# Patient Record
Sex: Female | Born: 1980
Health system: Southern US, Community
[De-identification: ages and names within clinical notes are randomized; demographics above are authoritative.]

## PROBLEM LIST (undated history)

## (undated) ENCOUNTER — Emergency Department (HOSPITAL_COMMUNITY): Discharge status: Absent without leave | Payer: Self-pay

## (undated) DIAGNOSIS — R5383 Other fatigue: Secondary | ICD-10-CM

## (undated) DIAGNOSIS — E559 Vitamin D deficiency, unspecified: Secondary | ICD-10-CM

## (undated) DIAGNOSIS — K589 Irritable bowel syndrome without diarrhea: Secondary | ICD-10-CM

## (undated) DIAGNOSIS — E539 Vitamin B deficiency, unspecified: Secondary | ICD-10-CM

## (undated) DIAGNOSIS — D649 Anemia, unspecified: Secondary | ICD-10-CM

## (undated) DIAGNOSIS — R635 Abnormal weight gain: Secondary | ICD-10-CM

## (undated) DIAGNOSIS — E88819 Insulin resistance, unspecified: Secondary | ICD-10-CM

## (undated) DIAGNOSIS — Z5189 Encounter for other specified aftercare: Secondary | ICD-10-CM

## (undated) DIAGNOSIS — I493 Ventricular premature depolarization: Secondary | ICD-10-CM

## (undated) DIAGNOSIS — G43909 Migraine, unspecified, not intractable, without status migrainosus: Secondary | ICD-10-CM

## (undated) DIAGNOSIS — Z780 Asymptomatic menopausal state: Secondary | ICD-10-CM

## (undated) DIAGNOSIS — T7840XA Allergy, unspecified, initial encounter: Secondary | ICD-10-CM

## (undated) DIAGNOSIS — E8881 Metabolic syndrome: Secondary | ICD-10-CM

## (undated) DIAGNOSIS — K219 Gastro-esophageal reflux disease without esophagitis: Secondary | ICD-10-CM

## (undated) DIAGNOSIS — R0602 Shortness of breath: Secondary | ICD-10-CM

## (undated) HISTORY — DX: Insulin resistance, unspecified: E88.819

## (undated) HISTORY — DX: Shortness of breath: R06.02

## (undated) HISTORY — DX: Irritable bowel syndrome, unspecified: K58.9

## (undated) HISTORY — PX: ENDOMETRIAL ABLATION: SHX621

## (undated) HISTORY — DX: Abnormal weight gain: R63.5

## (undated) HISTORY — DX: Metabolic syndrome: E88.81

## (undated) HISTORY — DX: Gastro-esophageal reflux disease without esophagitis: K21.9

## (undated) HISTORY — DX: Asymptomatic menopausal state: Z78.0

## (undated) HISTORY — DX: Anemia, unspecified: D64.9

## (undated) HISTORY — DX: Vitamin D deficiency, unspecified: E55.9

## (undated) HISTORY — DX: Encounter for other specified aftercare: Z51.89

## (undated) HISTORY — DX: Allergy, unspecified, initial encounter: T78.40XA

## (undated) HISTORY — DX: Vitamin B deficiency, unspecified: E53.9

## (undated) HISTORY — PX: BREAST EXCISIONAL BIOPSY: SUR124

## (undated) HISTORY — DX: Other fatigue: R53.83

---

## 2002-05-14 ENCOUNTER — Inpatient Hospital Stay (HOSPITAL_COMMUNITY): Admission: AD | Admit: 2002-05-14 | Discharge: 2002-05-14 | Payer: Self-pay | Admitting: Obstetrics and Gynecology

## 2002-06-04 HISTORY — PX: TUBAL LIGATION: SHX77

## 2002-06-22 ENCOUNTER — Inpatient Hospital Stay (HOSPITAL_COMMUNITY): Admission: AD | Admit: 2002-06-22 | Discharge: 2002-06-24 | Payer: Self-pay | Admitting: Obstetrics and Gynecology

## 2002-06-23 ENCOUNTER — Encounter (INDEPENDENT_AMBULATORY_CARE_PROVIDER_SITE_OTHER): Payer: Self-pay | Admitting: *Deleted

## 2004-11-25 ENCOUNTER — Other Ambulatory Visit: Admission: RE | Admit: 2004-11-25 | Discharge: 2004-11-25 | Payer: Self-pay | Admitting: Obstetrics and Gynecology

## 2005-05-13 ENCOUNTER — Emergency Department (HOSPITAL_COMMUNITY): Admission: EM | Admit: 2005-05-13 | Discharge: 2005-05-14 | Payer: Self-pay | Admitting: Emergency Medicine

## 2006-07-23 ENCOUNTER — Encounter (INDEPENDENT_AMBULATORY_CARE_PROVIDER_SITE_OTHER): Payer: Self-pay | Admitting: *Deleted

## 2006-07-23 ENCOUNTER — Ambulatory Visit (HOSPITAL_COMMUNITY): Admission: RE | Admit: 2006-07-23 | Discharge: 2006-07-23 | Payer: Self-pay | Admitting: Obstetrics and Gynecology

## 2008-05-04 HISTORY — PX: WISDOM TOOTH EXTRACTION: SHX21

## 2008-07-05 ENCOUNTER — Ambulatory Visit (HOSPITAL_COMMUNITY): Admission: RE | Admit: 2008-07-05 | Discharge: 2008-07-05 | Payer: Self-pay | Admitting: *Deleted

## 2008-11-22 ENCOUNTER — Ambulatory Visit: Payer: Self-pay | Admitting: Diagnostic Radiology

## 2008-11-22 ENCOUNTER — Emergency Department (HOSPITAL_BASED_OUTPATIENT_CLINIC_OR_DEPARTMENT_OTHER): Admission: EM | Admit: 2008-11-22 | Discharge: 2008-11-22 | Payer: Self-pay | Admitting: Emergency Medicine

## 2009-10-25 ENCOUNTER — Encounter: Payer: Self-pay | Admitting: Cardiology

## 2010-09-19 NOTE — Op Note (Signed)
NAMEKEMARI, MARES                ACCOUNT NO.:  [id number]   MEDICAL RECORD NO.:  [id number]                   PATIENT TYPE:  INP   LOCATION:  9142                                 [facility]ILITY:  [facility]   PHYSICIAN:  Janine Limbo, M.D.            DATE OF BIRTH:  Jul 18, 1980   DATE OF PROCEDURE:  06/23/2002  DATE OF DISCHARGE:                                 OPERATIVE REPORT   PREOPERATIVE DIAGNOSES:  1. Postpartum day 0.  2. Desires sterilization.   POSTOPERATIVE DIAGNOSES:  1. Postpartum day 0.  2. Desires sterilization.   PROCEDURE:  Modified [place] bilateral tubal ligation.   SURGEON:  Janine Limbo, M.D.   ANESTHESIA:  Epidural.   DISPOSITION:  The patient is a 30 year old female, now para 2-0-0-2, who  desires permanent sterilization.  She understands the implications for her  procedure, and she accepts the risk of, but not limited to, anesthetic  complications, bleeding, infection, and possible damage to the surrounding  organs.  She understands that there is a small but real failure rate  associated with this procedure (17 per 1000).   FINDINGS:  The fallopian tubes were normal bilaterally.   DESCRIPTION OF PROCEDURE:  The patient was taken to the operating room,  where it was determined that the epidural she had for her labor would be  adequate for her tubal ligation.  The patient's abdomen was prepped with  multiple layers of Betadine and then sterilely draped.  The subumbilical  area was injected with 10 mL of 0.5% Marcaine with epinephrine.  A  subumbilical incision was made and carried sharply through the subcutaneous  tissue, the fascia, and the anterior peritoneum.  The left fallopian tube  was identified and followed to its fimbriated end.  A knuckle of tube was  made on the left using a free tie and then a tie of suture ligature of 0  plain catgut.  The knuckle of tube thus made was excised.  Hemostasis was  noted to be adequate.  An  identical procedure was carried out on the  opposite side.  Again hemostasis was noted to be adequate.  All instruments  were then removed.  The anterior peritoneum and the fascia were closed using  a running suture of 2-0 Vicryl.  The skin was reapproximated using a  subcuticular suture of 4-0 Vicryl.  Sponge, needle, and instrument counts  were correct on two occasions.  The estimated blood loss was less than 5 mL.  The patient tolerated her procedure well.  She was taken to the recovery  room in stable condition.                                               Janine Limbo, M.D.    AVS/MEDQ  D:  06/23/2002  T:  06/24/2002  Job:  [id number]  

## 2010-09-19 NOTE — H&P (Signed)
NAMEMILKA, WINDHOLZ              ACCOUNT NO.:  [id number]   MEDICAL RECORD NO.:  [id number]          PATIENT TYPE:  AMB   LOCATION:  SDC                           [facility]   PHYSICIAN:  Janine Limbo, M.D.DATE OF BIRTH:  1981-01-19   DATE OF ADMISSION:  07/23/2006  DATE OF DISCHARGE:                              HISTORY & PHYSICAL   HISTORY OF PRESENT ILLNESS:  [name] is a 30 year old female, para 2-0-  0-2, who presents for hysteroscopy with possible resection and  dilatation and curettage.  The patient has been followed at the [facility].  The patient has a history of menometrorrhagia.  A hydrosonogram  was performed and the patient was found to have polyps within the  uterus.   OBSTETRICAL HISTORY:  The patient has had two term vaginal deliveries.   DRUG ALLERGIES:  NO KNOWN DRUG ALLERGIES.   PAST MEDICAL HISTORY:  The patient denies hypertension and diarrhea.  The patient was born prematurely herself and did receive a blood  transfusion at birth.   SOCIAL HISTORY:  The patient denies cigarette use, alcohol use, and  recreational drug use.   REVIEW OF SYSTEMS:  Please see the history of present illness.   FAMILY HISTORY:  Noncontributory.   PHYSICAL EXAMINATION:  VITAL SIGNS:  Weight is 143 pounds, height is 5  feet 3 inches.  HEENT:  Within normal limits.  CHEST:  Clear.  HEART:  Regular rate and rhythm.  BREASTS:  Without masses.  ABDOMEN:  Nontender.  EXTREMITIES:  Grossly normal.  NEUROLOGIC:  Grossly normal.  PELVIC:  External genitalia is normal.  Vagina is normal.  Cervix is  nontender.  Uterus is upper limits normal size.  Adnexa:  No masses.  Rectovaginal exam confirms.   ASSESSMENT:  1. Menometrorrhagia.  2. Endometrial polyps.   PLAN:  The patient will undergo hysteroscopy with possible resection of  polyps.  She will then undergo a dilatation and curettage.   She  understands the indications for her surgical procedure and she accepts  the associated risk.      Janine Limbo, M.D.  Electronically Signed     AVS/MEDQ  D:  07/22/2006  T:  07/22/2006  Job:  [id number]

## 2010-09-19 NOTE — Op Note (Signed)
Anna [name], Anna [name]              ACCOUNT NO.:  [id number]   MEDICAL RECORD NO.:  [id number]          PATIENT TYPE:  AMB   LOCATION:  SDC                           [facility]   PHYSICIAN:  Janine Limbo, M.D.DATE OF BIRTH:  30-Nov-1980   DATE OF PROCEDURE:  07/23/2006  DATE OF DISCHARGE:                               OPERATIVE REPORT   PREOPERATIVE DIAGNOSES:  1. Menometrorrhagia.  2. Endometrial polyps.   POSTOPERATIVE DIAGNOSES:  1. Menometrorrhagia.  2. Endometrial polyps.   PROCEDURE:  1. Hysteroscopy with resection of polyps.  2. Dilatation and curettage.   SURGEON:  Janine Limbo, M.D.   FIRST ASSISTANT:  None.   ANESTHESIA:  General.   DISPOSITION:  [name] is a 30 year old female who presents  with menorrhagia.  Sonohysterogram showed endometrial polyps.  The  patient understands the indications for her surgical procedure, and she  accepts the risk of, but not limited to, anesthetic complications,  bleeding, infection, and possible damage to surrounding organs.   FINDINGS:  The uterus was upper limits normal size.  No adnexal masses  were appreciated on exam under anesthesia.  The uterus sounded to 8 cm.  Upon inspection of the endometrium, the patient was noted to have two  endometrial polyps on the right posterior portion of the endometrium.  No other abnormality or pathology was noted.   PROCEDURE:  The patient was taken to the operating room where a general  anesthetic was given.  The patient's abdomen, perineum, and vagina were  prepped with multiple layers of Betadine.  An examination under  anesthesia was performed.  The bladder was drained of urine.  The  patient was then sterilely draped.  A paracervical block was placed  using 10 mL of 0.5% Marcaine with epinephrine.  An additional 10 mL of  0.5% Marcaine with epinephrine were injected directly into the cervix.   An endocervical curettage was obtained.  The uterus was sounded  to 8 cm.  The cervix was then gently dilated.  The diagnostic hysteroscope was  inserted, and the cavity was carefully inspected.  The polyps were  noted.  The operative hysteroscope was then substituted for the  diagnostic hysteroscope.  Using a single loop, the polyps on the  posterior portion of the endometrium were removed.  Hemostasis was noted  to be adequate.  The cavity was then carefully inspected, and no other  pathology was noted.  The hysteroscope was removed.  The endometrial  cavity was then curetted using a medium sharp curette until it was felt  to be completely clean.  Hemostasis was adequate.  The hysteroscope was  again inserted, and the cavity was inspected.  There was no evidence of  damage and no other polyps were noted.  All instruments were then  removed.  Examination under anesthesia was repeated.  The uterus was  noted to be firm.  Sponge, needle, and instrument counts were correct on  two occasions.  The estimated blood loss was 5 mL.  Estimated fluid  deficit was 95 mL, although there was fluid noted on the operator's gown  and on the operating room floor.   The patient was awakened from her anesthetic without difficulty and then  taken to the recovery room in stable condition.   FOLLOWUP INSTRUCTIONS:  The patient will take ibuprofen 600 mg every six  hours as needed for mild to moderate pain.  She will take Vicodin one to  two tablets every four hours as needed for severe pain.  She will return  to see Dr. Stefano Gaul in two to three weeks for followup examination.  She  was given a copy of the postoperative instruction sheet as prepared by  the [facility] for patients who have undergone a  dilatation and curettage.   The endocervical curettage, endometrial polyp, and the endometrial  curettings were all sent to pathology for evaluation.      Janine Limbo, M.D.  Electronically Signed     AVS/MEDQ  D:  07/23/2006  T:   07/23/2006  Job:  [id number]

## 2010-09-19 NOTE — H&P (Signed)
Anna [name], Anna [name]                ACCOUNT NO.:  [id number]   MEDICAL RECORD NO.:  [id number]                   PATIENT TYPE:  INP   LOCATION:  9142                                 [facility]   PHYSICIAN:  Janine Limbo, M.D.            DATE OF BIRTH:  09/14/1980   DATE OF ADMISSION:  06/22/2002  DATE OF DISCHARGE:                                HISTORY & PHYSICAL   HISTORY OF PRESENT ILLNESS:  [name] is a 30 year old married  black female, Gravida II, Para I, 0/0/1 at 34 and 4/7th weeks who presents  with regular uterine contractions since 8:30. She denies leaking, bleeding,  nausea and vomiting. No visual disturbances or headaches. She reports  positive fetal movement. Her pregnancy has been followed by the [facility] and [facility] and has been  remarkable for history of SGA and questionable oligohydramnios with the  first pregnancy; history of abnormal PAP; group B strep negative; and  desires bilateral tubal ligation. Her OB labs were collected on April 04, 2002. Hemoglobin was 10.7 and hematocrit 33.0. Platelets 165,000. Blood type  O+, antibody negative, sickle cell trait negative, RPR non-reactive, Rubella  immune, hepatitis C surface antigen negative, HIV non-reactive, cystic  fibrosis negative. One hour Glucola on April 04, 2002 was 104. Hemoglobin  at that time was 11.0. Culture of vaginal tract for group B strep on  June 07, 2002 was negative.   HISTORY OF PRESENT ILLNESS:  She presented for care on January 31, 2002 at  [redacted] weeks gestation. She was diagnosed with bacterial vaginosis at [redacted] weeks  gestation. Pregnancy ultrasonography at that time showed growth consistent  with previous staging. She reported left inguinal swelling and pain at [redacted]  weeks gestation for which she was given a referral to Dr. Lurene Shadow. That pain  actually resolved as the pregnancy progressed. She reported cramping  at [redacted]  weeks gestation and fetal fibronectin was collected which was negative.  Pregnancy ultrasonography at [redacted] weeks gestation due to size less than dates  showed growth consistent with previous staging. The rest of her prenatal  care was unremarkable.   OBSTETRIC HISTORY:  She is Gravida II, Para I, 0/0/1. In October of 2000,  she vaginally delivered a female infant named Keanthony at [redacted] weeks gestation  after a 7 hour labor. She had an epidural for anesthesia. She was induced  secondary to questionable oligohydramnios.   ALLERGIES:  No known drug allergies.   MEDICATIONS:  She has used Depo-Provera in the past for contraception.   PAST MEDICAL HISTORY:  She had mild dysplasia on her PAP smear in 2002  followed by colposcopy. She has occasional yeast infections. She reports  having had the usual childhood illnesses. She received a blood transfusion  at birth. She has had one episode of cystitis.   FAMILY HISTORY:  Maternal grandfather with myocardial infarction. Maternal  grandmother with heart disease. Mother and multiple other family members  with hypertension. Mother with anemia. Maternal grandmother, grandfather and  paternal grandmother with diabetes mellitus. Maternal grandfather on  dialysis. Multiple family members with history of cancer. Maternal  grandfather with history of cardiovascular accident.   GENETIC HISTORY:  Remarkable for father of the baby's first cousin with  polydactylia and the patient's son has a heart murmur. First cousin with  mitral valve prolapse.   SOCIAL HISTORY:  The patient is married to the father of the baby. His name  is Earl Lites. The patient has one year of college education. Father of the  baby is high school educated and both are employed. They deny any alcohol,  tobacco, or illicit drug use with the pregnancy.   PHYSICAL EXAMINATION:  VITAL SIGNS: Stable. Afebrile.  HEENT: Grossly within normal limits.  CHEST: Clear to auscultation and  percussion.  HEART: Regular rate and rhythm.  ABDOMEN: Gravid and contour fundal height extending approximately 37 cm  above the pubic synthesis. Electronic fetal monitoring showed reassuring  fetal heart rate with uterine contractions every 1 1/2 to 2 1/2 minutes.  Cervical examination 5 cm, 100% effaced, vertex minus 1 with bulging bad of  water.  EXTREMITIES: Within normal limits.   ASSESSMENT:  1. Pregnancy at term.  2. Active labor.   PLAN:  1. Submit to birthing suite for consultation with Dr. Stefano Gaul.  2. Routine orders.  3. Planned epidural.  4. Spontaneous vaginal birth.     Cam Hai, C.N.M.                     Janine Limbo, M.D.    KS/MEDQ  D:  06/22/2002  T:  06/23/2002  Job:  [id number]   cc:   Janine Limbo, M.D.  [address], [address]  [place]  [place] [address]  Fax: [phone]   Leonie Man, M.D.  [address], [address]  [place]  [place] [address]  Fax: [phone]

## 2010-09-19 NOTE — Discharge Summary (Signed)
   Anna [name], Anna [name]                ACCOUNT NO.:  [id number]   MEDICAL RECORD NO.:  [id number]                   PATIENT TYPE:  INP   LOCATION:  9142                                 [facility]   PHYSICIAN:  Janine Limbo, M.D.            DATE OF BIRTH:  20-Feb-1981   DATE OF ADMISSION:  06/22/2002  DATE OF DISCHARGE:  06/24/2002                                 DISCHARGE SUMMARY   ADMISSION DIAGNOSES:  Intrauterine pregnancy, at term, after labor.  Multiparity, and desires bilateral tubal ligation for sterilization.   DISCHARGE DIAGNOSES:  Intrauterine pregnancy, at term, after labor.  Multiparity, and desires bilateral tubal ligation for sterilization.  Status  post normal spontaneous vaginal delivery and status post bilateral tubal  ligation for sterilization, and bottle feeding.   PROCEDURE WITH ADMISSION:  1. A normal spontaneous vaginal delivery on June 23, 2002, of a viable     female infant named Earl Lites.  He had Apgars of 8 and 9, and weighed 6     pounds 7 ounces, and was attended and delivered by Philipp Deputy, C.N.M.  2. Bilateral tubal ligation for sterilization on June 23, 2002, attended     by Dr. Leonard Schwartz.   HOSPITAL COURSE:  The patient is a 30 year old married white female, gravida  2, para 1-0-0-1, at 63 and 4/7 weeks, admitted in active labor who  progressed to normal spontaneous vaginal delivery of a viable female infant  named Engineer, agricultural.  He had Apgars of 8 and 9 and weighed 6 pounds 7  ounces, and he was delivered over an intact perineum with no lacerations.  He was attended and delivered by Philipp Deputy, C.N.M.  The patient desired  bilateral tubal ligation for sterilization and underwent the same on  June 23, 2002, attended by Dr. Leonard Schwartz, with no  complications.  Please see operative note for details.  Postoperatively and  postpartum, the patient has done well.  She is ambulating, voiding, eating  without difficulty.  She is bottle-feeding.  Her vital signs have been  stable, and she has been afebrile throughout her hospital stay.  She is  deemed ready for discharge today.   DISCHARGE INSTRUCTIONS:  As per the [facility] handout.   DISCHARGE MEDICATIONS:  Motrin 600 mg p.o. q.6h. p.r.n. for pain, Tylox 1 to  2 p.o. q.4-6h. p.r.n. for pain, prenatal vitamins daily.   DISCHARGE LABORATORY DATA:  Her hemoglobin is 9.8.  Her WBC count is 10.1,  and her platelets are 141.   DISCHARGE FOLLOWUP:  Will be in 6 weeks at [facility] or p.r.n.     Anna [name], C.N.M.              Janine Limbo, M.D.    SJD/MEDQ  D:  06/24/2002  T:  06/24/2002  Job:  [id number]

## 2010-09-23 ENCOUNTER — Ambulatory Visit (INDEPENDENT_AMBULATORY_CARE_PROVIDER_SITE_OTHER): Payer: 59 | Admitting: Family Medicine

## 2010-09-23 ENCOUNTER — Encounter: Payer: Self-pay | Admitting: Family Medicine

## 2010-09-23 VITALS — BP 100/78 | HR 64 | Temp 98.2°F | Wt 138.0 lb

## 2010-09-23 DIAGNOSIS — M25569 Pain in unspecified knee: Secondary | ICD-10-CM

## 2010-09-23 DIAGNOSIS — M25562 Pain in left knee: Secondary | ICD-10-CM | POA: Insufficient documentation

## 2010-09-23 NOTE — Patient Instructions (Signed)
Knee Pain, General The knee is the complex joint between your thigh and your lower leg. It is made up of bones, tendons, ligaments, and cartilage. The bones that make up the knee are:  The femur in the thigh.   The tibia and fibula in the lower leg.   The patella or kneecap riding in the groove on the lower femur.  CAUSES Knee pain is a common complaint with many causes.  A few of these causes are:  Injury, such as:   A ruptured ligament or tendon injury.   Torn cartilage.    Medical conditions, such as:   Gout   Arthritis   Infections   Overuse, over training or overdoing a physical activity.  Knee pain can be minor or severe. Knee pain can accompany debilitating injury. Minor knee problems often respond well to self-care measures or get well on their own. More serious injuries may need medical intervention or even surgery. Symptoms The knee is complex. Symptoms of knee problems can vary widely. Some of the problems are:  Pain with movement and weight bearing.  Swelling and tenderness.   Buckling of the knee.   Inability to straighten or extend your knee.   Your knee locks and you cannot straighten it.  Warmth and redness with pain and fever.   Deformity or dislocation of the kneecap.   DIAGNOSIS Determining what is wrong may be very straight forward such as when there is an injury. It can also be challenging because of the complexity of the knee. Tests to make a diagnosis may include:  Your caregiver taking a history and doing a physical exam.   Routine X-rays can be used to rule out other problems. X-rays will not reveal a cartilage tear. Some injuries of the knee can be diagnosed by:   Arthroscopy a surgical technique by which a small video camera is inserted through tiny incisions on the sides of the knee. This procedure is used to examine and repair internal knee joint problems. Tiny instruments can be used during arthroscopy to repair the torn knee cartilage  (meniscus).   Arthrography is a radiology technique. A contrast liquid is directly injected into the knee joint. Internal structures of the knee joint then become visible on X-ray film.   An MRI scan is a non x-ray radiology procedure in which magnetic fields and a computer produce two- or three-dimensional images of the inside of the knee. Cartilage tears are often visible using an MRI scanner. MRI scans have largely replaced arthrography in diagnosing cartilage tears of the knee.   Blood work.   Examination of the fluid that helps to lubricate the knee joint (synovial fluid). This is done by taking a sample out using a needle and a syringe.  treatment The treatment of knee problems depends on the cause. Some of these treatments are:  Depending on the injury, proper casting, splinting, surgery or physical therapy care will be needed.   Give yourself adequate recovery time. Do not overuse your joints. If you begin to get sore during workout routines, back off. Slow down or do fewer repetitions.   For repetitive activities such as cycling or running, maintain your strength and nutrition.   Alternate muscle groups. For example if you are a weight lifter, work the upper body on one day and the lower body the next.   Either tight or weak muscles do not give the proper support for your knee. Tight or weak muscles do not absorb the stress placed   on the knee joint. Keep the muscles surrounding the knee strong.   Take care of mechanical problems.   If you have flat feet, orthotics or special shoes may help. See your caregiver if you need help.   Arch supports, sometimes with wedges on the inner or outer aspect of the heel, can help. These can shift pressure away from the side of the knee most bothered by osteoarthritis.   A brace called an "unloader" brace also may be used to help ease the pressure on the most arthritic side of the knee.   If your caregiver has prescribed crutches, braces,  wraps or ice, use as directed. The acronym for this is PRICE. This means protection, rest, ice, compression and elevation.   Nonsteroidal anti-inflammatory drugs (NSAID's), can help relieve pain. But if taken immediately after an injury, they may actually increase swelling. Take NSAID's with food in your stomach. Stop them if you develop stomach problems. Do not take these if you have a history of ulcers, stomach pain or bleeding from the bowel. Do not take without your caregiver's approval if you have problems with fluid retention, heart failure, or kidney problems.   For ongoing knee problems, physical therapy may be helpful.   Glucosamine and chondroitin are over-the-counter dietary supplements. Both may help relieve the pain of osteoarthritis in the knee. These medicines are different from the usual anti-inflammatory drugs. Glucosamine may decrease the rate of cartilage destruction.   Injections of a corticosteroid drug into your knee joint may help reduce the symptoms of an arthritis flare-up. They may provide pain relief that lasts a few months. You may have to wait a few months between injections. The injections do have a small increased risk of infection, water retention and elevated blood sugar levels.   Hyaluronic acid injected into damaged joints may ease pain and provide lubrication. These injections may work by reducing inflammation. A series of shots may give relief for as long as 6 months.   Topical painkillers. Applying certain ointments to your skin may help relieve the pain and stiffness of osteoarthritis. Ask your pharmacist for suggestions. Many over the-counter products are approved for temporary relief of arthritis pain.   In some countries, doctors often prescribe topical NSAID's for relief of chronic conditions such as arthritis and tendinitis. A review of treatment with NSAID creams found that they worked as well as oral medications but without the serious side effects.    Prevention  Maintain a healthy weight. Extra pounds put more strain on your joints.   Get strong, stay limber. Weak muscles are a common cause of knee injuries. Stretching is important. Include flexibility exercises in your workouts.   Be smart about exercise. If you have osteoarthritis, chronic knee pain or recurring injuries, you may need to change the way you exercise. This does not mean you have to stop being active. If your knees ache after jogging or playing basketball, consider switching to swimming, water aerobics or other low-impact activities, at least for a few days a week. Sometimes limiting high-impact activities will provide relief.   Make sure your shoes fit well. Choose footwear that is right for your sport.   Protect your knees. Use the proper gear for knee-sensitive activities. Use kneepads when playing volleyball or laying carpet. Buckle your seat belt every time you drive. Most shattered kneecaps occur in car accidents.   Rest when you are tired.  SEEK MEDICAL CARE IF: You have knee pain that is continual and does not seem   to be getting better.  Seek IMMEDIATE MEDICAL CARE IF:  Your knee joint feels hot to the touch and you have a high fever. MAKE SURE YOU:   Understand these instructions.   Will watch your condition.   Will get help right away if you are not doing well or get worse.  Document Released: 02/15/2007 Document Re-Released: 07/15/2009 [employer] Patient Information 2011 [employer]. 

## 2010-09-23 NOTE — Progress Notes (Signed)
  Subjective:    Anna [name] is a 30 y.o. female who presents with knee pain involving the left knee. Onset was gradual, starting about 7 days ago. Inciting event: none known. Current symptoms include: pain located patella and fluid filled "knot". Pain is aggravated by touching and grinding when walks. Patient has had prior knee problems. Evaluation to date: plain films: degenerative changes and PT evaluation done about 5 years ago---cortisone injection done by UC. Treatment to date: corticosteroid injection which was effective.  The following portions of the patient's history were reviewed and updated as appropriate: allergies, current medications, past family history, past medical history, past social history, past surgical history and problem list.   Review of Systems Pertinent items are noted in HPI.   Objective:    BP 100/78  Pulse 64  Temp(Src) 98.2 F (36.8 C) (Oral)  Wt 138 lb (62.596 kg)  SpO2 98% Right knee: normal and no effusion, full active range of motion, no joint line tenderness, ligamentous structures intact.  Left knee:   + fluid filled tender area ant patella,  Otherwise no errythema   X-ray left knee: not done    Assessment:    Left knee pain    Plan:    Rest, ice, compression, and elevation (RICE) therapy. OTC analgesics as needed. Orthopedics referral.

## 2011-03-07 ENCOUNTER — Inpatient Hospital Stay (INDEPENDENT_AMBULATORY_CARE_PROVIDER_SITE_OTHER)
Admission: RE | Admit: 2011-03-07 | Discharge: 2011-03-07 | Disposition: A | Discharge status: Home / Self Care | Payer: 59 | Source: Ambulatory Visit | Attending: Family Medicine | Admitting: Family Medicine

## 2011-03-07 DIAGNOSIS — R6889 Other general symptoms and signs: Secondary | ICD-10-CM

## 2011-03-07 LAB — POCT URINALYSIS DIP (DEVICE)
Bilirubin Urine: NEGATIVE
Protein, ur: NEGATIVE mg/dL
Urobilinogen, UA: 1 mg/dL (ref 0.0–1.0)
pH: 6.5 (ref 5.0–8.0)

## 2011-03-07 LAB — WET PREP, GENITAL: Yeast Wet Prep HPF POC: NONE SEEN

## 2011-03-07 LAB — POCT PREGNANCY, URINE: Preg Test, Ur: NEGATIVE

## 2011-03-09 ENCOUNTER — Telehealth: Payer: Self-pay | Admitting: *Deleted

## 2011-03-09 LAB — GC/CHLAMYDIA PROBE AMP, GENITAL: GC Probe Amp, Genital: NEGATIVE

## 2011-03-09 MED ORDER — CEPHALEXIN 500 MG PO CAPS: 500.0000 mg | ORAL_CAPSULE | Freq: Four times a day (QID) | ORAL | Status: AC

## 2011-03-09 NOTE — Telephone Encounter (Signed)
Per Dr Laury Axon call Rx for keflex 500mg  1 qd bid for 10 days. Pt aware Rx sent.

## 2011-03-10 NOTE — ED Notes (Signed)
Pt. called back and verified x 2. Pt. given results and told she needs a Rx. of Flagyl.  Pt. wants Rx. Called to [facility] on [address]  Rx. Called to pharmacist @ [phone].

## 2011-03-10 NOTE — ED Notes (Addendum)
GC neg., Chlamydia neg., Wet prep: Mod. Clue cells, mod. WBC's.  Labs shown to Dr. Artis Flock and  Order obtained for Flagyl 250 mg. #21 1 TID.  I called pt and left message to call. Contact # not in service

## 2011-04-14 ENCOUNTER — Other Ambulatory Visit: Payer: Self-pay | Admitting: Family Medicine

## 2011-04-14 DIAGNOSIS — Z2089 Contact with and (suspected) exposure to other communicable diseases: Secondary | ICD-10-CM

## 2011-04-14 MED ORDER — PERMETHRIN 5 % EX CREA: TOPICAL_CREAM | CUTANEOUS | Status: DC

## 2011-05-21 ENCOUNTER — Encounter: Payer: Self-pay | Admitting: Family Medicine

## 2011-09-22 ENCOUNTER — Other Ambulatory Visit: Payer: Self-pay

## 2011-09-22 MED ORDER — VALACYCLOVIR HCL 500 MG PO TABS: 500.0000 mg | ORAL_TABLET | Freq: Every day | ORAL | Status: DC

## 2011-09-24 ENCOUNTER — Other Ambulatory Visit: Payer: Self-pay | Admitting: Obstetrics and Gynecology

## 2011-09-24 MED ORDER — VALACYCLOVIR HCL 500 MG PO TABS: 500.0000 mg | ORAL_TABLET | Freq: Every day | ORAL | Status: AC

## 2011-09-25 ENCOUNTER — Telehealth: Payer: Self-pay | Admitting: Obstetrics and Gynecology

## 2011-09-25 ENCOUNTER — Encounter: Payer: 59 | Admitting: Obstetrics and Gynecology

## 2011-10-14 ENCOUNTER — Ambulatory Visit (INDEPENDENT_AMBULATORY_CARE_PROVIDER_SITE_OTHER): Payer: 59 | Admitting: Obstetrics and Gynecology

## 2011-10-14 ENCOUNTER — Encounter: Payer: Self-pay | Admitting: Obstetrics and Gynecology

## 2011-10-14 VITALS — BP 100/70 | Resp 16 | Ht 62.5 in | Wt 136.0 lb

## 2011-10-14 DIAGNOSIS — R51 Headache: Secondary | ICD-10-CM

## 2011-10-14 DIAGNOSIS — R519 Headache, unspecified: Secondary | ICD-10-CM | POA: Insufficient documentation

## 2011-10-14 DIAGNOSIS — K589 Irritable bowel syndrome without diarrhea: Secondary | ICD-10-CM

## 2011-10-14 DIAGNOSIS — Z01419 Encounter for gynecological examination (general) (routine) without abnormal findings: Secondary | ICD-10-CM

## 2011-10-14 DIAGNOSIS — Z124 Encounter for screening for malignant neoplasm of cervix: Secondary | ICD-10-CM

## 2011-10-14 NOTE — Progress Notes (Signed)
Regular Periods: no Mammogram: no  Monthly Breast Ex.: yes Exercise: yes  Tetanus < 10 years: yes Seatbelts: yes  NI. Bladder Functn.: yes Abuse at home: no  Daily BM's: yes Stressful Work: no  Healthy Diet: yes Sigmoid-Colonoscopy: per pt never.  Calcium: no Medical problems this year: pt states she has pain on left side ovulating.    LAST PAP:09/17/2010 "WNL"  Contraception: BTL   Mammogram:  Per pt never  PCP: Dr.Lowne  PMH: Per pt No changes  FMH: Per Pt no changes.  Last Bone Scan: per pt never.  Subjective:    Anna [name] is a 31 y.o. female, No obstetric history on file., who presents for an annual exam. See above. The patient has a history of herpes virus type I.  She takes Valtrex as needed.  She has irritable bowel syndrome that causes problems for her when she is stressed.  Her headaches are better.  She is not sexually reactive. She is status post endometrial ablation and bilateral tubal ligation.  She had one episode of ovulatory pain in the left lower quadrant.  That has now resolved.  Prior Hysterectomy: No    History   Social History  . Marital Status: Divorced    Spouse Name: N/A    Number of Children: 2  . Years of Education: N/A   Social History Main Topics  . Smoking status: Never Smoker   . Smokeless tobacco: Never Used  . Alcohol Use: No  . Drug Use: No  . Sexually Active: None   Other Topics Concern  . None   Social History Narrative  . None    Menstrual cycle:   LMP: No LMP recorded. Patient has had an ablation.           Cycle: rare periods status post endometrial ablation  The following portions of the patient's history were reviewed and updated as appropriate: allergies, current medications, past family history, past medical history, past social history, past surgical history and problem list.  Review of Systems Pertinent items are noted in HPI. Breast:Negative for breast lump,nipple discharge or nipple  retraction Gastrointestinal: Negative for abdominal pain, change in bowel habits or rectal bleeding Urinary:negative   Objective:    BP 100/70  Resp 16  Ht 5' 2.5" (1.588 m)  Wt 136 lb (61.689 kg)  BMI 24.48 kg/m2    Weight:  Wt Readings from Last 1 Encounters:  10/14/11 136 lb (61.689 kg)          BMI: Body mass index is 24.48 kg/(m^2).  General Appearance: Alert, appropriate appearance for age. No acute distress HEENT: Grossly normal Neck / Thyroid: Supple, no masses, nodes or enlargement Lungs: clear to auscultation bilaterally Back: No CVA tenderness Breast Exam: No masses or nodes.No dimpling, nipple retraction or discharge. Cardiovascular: Regular rate and rhythm. S1, S2, no murmur Gastrointestinal: Soft, non-tender, no masses or organomegaly  ++++++++++++++++++++++++++++++++++++++++++++++++++++++++  Pelvic Exam: External genitalia: normal general appearance Vaginal: normal without tenderness, induration or masses and relaxation noted Cervix: normal appearance Adnexa: normal bimanual exam Uterus: normal size shape and consistency Rectovaginal: not indicated  ++++++++++++++++++++++++++++++++++++++++++++++++++++++++  Lymphatic Exam: Non-palpable nodes in neck, clavicular, axillary, or inguinal regions Neurologic: Normal speech, no tremor  Psychiatric: Alert and oriented, appropriate affect.   Wet Prep:not applicable Urinalysis:not applicable UPT: Not done   Assessment:    Normal gyn exam   Herpes virus 1  Irritable bowel syndrome  Migraine headaches  Overweight or obese: No   Pelvic relaxation: Yes  Plan:    pap smear return annually or prn Contraception:bilateral tubal ligation    STD screen request: none  RPR: No.   HBsAg: No.  Hepatitis C: No.  The updated Pap smear screening guidelines were discussed with the patient. The patient requested that I obtain a Pap smear: Yes.  Kegel exercises discussed: Yes.  Proper diet and regular  exercise were reviewed.  Annual mammograms recommended starting at age 33. Proper breast care was discussed.  Screening colonoscopy is recommended beginning at age 39.  Regular health maintenance was reviewed.  Sleep hygiene was discussed.  Adequate calcium and vitamin D intake was emphasized.  Mylinda Latina.D.

## 2011-10-15 LAB — PAP IG W/ RFLX HPV ASCU

## 2011-10-24 ENCOUNTER — Emergency Department (HOSPITAL_COMMUNITY)
Admission: EM | Admit: 2011-10-24 | Discharge: 2011-10-24 | Disposition: A | Discharge status: Home / Self Care | Payer: 59 | Source: Home / Self Care | Attending: Emergency Medicine | Admitting: Emergency Medicine

## 2011-10-24 ENCOUNTER — Encounter (HOSPITAL_COMMUNITY): Payer: Self-pay

## 2011-10-24 DIAGNOSIS — R002 Palpitations: Secondary | ICD-10-CM

## 2011-10-24 HISTORY — DX: Migraine, unspecified, not intractable, without status migrainosus: G43.909

## 2011-10-24 HISTORY — DX: Ventricular premature depolarization: I49.3

## 2011-10-24 NOTE — Discharge Instructions (Signed)
Follow up with a cardiologist.  No running unless the cardiologist says it's ok to do.    Palpitations  A palpitation is the feeling that your heartbeat is irregular or is faster than normal. Although this is frightening, it usually is not serious. Palpitations may be caused by excesses of smoking, caffeine, or alcohol. They are also brought on by stress and anxiety. Sometimes, they are caused by heart disease. Unless otherwise noted, your caregiver did not find any signs of serious illness at this time. HOME CARE INSTRUCTIONS  To help prevent palpitations:  Drink decaffeinated coffee, tea, and soda pop. Avoid chocolate.   If you smoke or drink alcohol, quit or cut down as much as possible.   Reduce your stress or anxiety level. Biofeedback, yoga, or meditation will help you relax. Physical activity such as swimming, jogging, or walking also may be helpful.  SEEK MEDICAL CARE IF:   You continue to have a fast heartbeat.   Your palpitations occur more often.  SEEK IMMEDIATE MEDICAL CARE IF: You develop chest pain, shortness of breath, severe headache, dizziness, or fainting. Document Released: 04/17/2000 Document Revised: 04/09/2011 Document Reviewed: 06/17/2007 [facility] Patient Information 2012 [place], [place].

## 2011-10-24 NOTE — ED Notes (Signed)
Pt states she has a hx of PVC but states they have gradually gotten worse over past 6 days and she has become symtomatic when working out, today felt chest tightness and dizziness.

## 2011-10-24 NOTE — ED Provider Notes (Signed)
History     CSN: [id number]  Arrival date & time 10/24/11  1207   First MD Initiated Contact with Patient 10/24/11 1452      Chief Complaint  Patient presents with  . Chest Pain    pvcs with chest tightness    (Consider location/radiation/quality/duration/timing/severity/associated sxs/prior treatment) HPI Comments: Pt reports seeing cardiologist 2 years ago for frequent PVCs.  Was supposed to have a stress test but never had it done.  In last 2 years, would occasional feel having one pvc, sensation would last a second and then be over.  5 days ago began having feeling of frequent PVCs again.  They last for hours.  Last night pt couldn't sleep she felt so many palpitations.  Also, pt has new sx where she feels chest tightness when she exercises that started 5 days ago, which is getting worse.  This morning while running she felt chest tightness and dizziness.  At this time, in [facility], she does not have any symptoms and feels fine.    Patient is a 31 y.o. female presenting with palpitations. The history is provided by the patient.  Palpitations  Episode onset: 5 days ago. The problem occurs daily. The problem has been gradually worsening. Associated symptoms include chest pressure and dizziness. Pertinent negatives include no diaphoresis, no fever, no chest pain, no syncope, no abdominal pain, no nausea, no vomiting, no headaches, no weakness and no shortness of breath. She has tried nothing for the symptoms.    Past Medical History  Diagnosis Date  . GERD (gastroesophageal reflux disease)   . IBS (irritable bowel syndrome)   . PVC (premature ventricular contraction)   . Migraine     Past Surgical History  Procedure Date  . Wisdom tooth extraction 2010  . Tubal ligation 06-04-2002    Family History  Problem Relation Age of Onset  . Arthritis Maternal Grandmother   . Arthritis Maternal Uncle   . Colon cancer Maternal Grandmother   . Ovarian cancer Maternal Aunt   . Prostate cancer  Maternal Uncle   . Stroke Maternal Grandfather   . Hypertension Maternal Grandmother   . Kidney disease Maternal Grandfather   . Diabetes Maternal Grandmother   . Diabetes Paternal Grandmother   . Bone cancer Paternal Grandmother     History  Substance Use Topics  . Smoking status: Never Smoker   . Smokeless tobacco: Never Used  . Alcohol Use: No    OB History    Grav Para Term Preterm Abortions TAB SAB Ect Mult Living                  Review of Systems  Constitutional: Negative for fever, diaphoresis and fatigue.  Respiratory: Negative for shortness of breath.   Cardiovascular: Positive for palpitations. Negative for chest pain and syncope.  Gastrointestinal: Negative for nausea, vomiting and abdominal pain.  Neurological: Positive for dizziness. Negative for weakness and headaches.    Allergies  Aciphex and Latex  Home Medications   Current Outpatient Rx  Name Route Sig Dispense Refill  . IMIPRAMINE HCL 25 MG PO TABS Oral Take 25 mg by mouth at bedtime.    Marland Kitchen PERMETHRIN 5 % EX CREA  As directed 60 g 2  . ALIGN PO Oral Take 1 tablet by mouth.      Marland Kitchen VALACYCLOVIR HCL 500 MG PO TABS Oral Take 1 tablet (500 mg total) by mouth daily. 100 tablet 0    BP 118/78  Pulse 71  Temp 98  F (36.7 C) (Oral)  Resp 19  SpO2 100%  Physical Exam  Constitutional: She appears well-developed and well-nourished. No distress.  Neck: Carotid bruit is not present.  Cardiovascular: Normal rate and regular rhythm.  Exam reveals no gallop and no friction rub.   No murmur heard. Pulmonary/Chest: Effort normal and breath sounds normal.    ED Course  Procedures (including critical care time)  Labs Reviewed - No data to display No results found.   1. Palpitations       MDM  Talked with Dr. Chaney Malling about plan for pt.  EKG today is normal, shows normal sinus rhythm, 73bpm. Asymptomatic at this time, pt is to f/u with cardiologist.  Has pcp, states pcp can set her up with appt.   No running until ok'd by cardio.         Cathlyn Parsons, NP 10/24/11 1513  Cathlyn Parsons, NP 10/24/11 1514

## 2011-10-25 NOTE — ED Provider Notes (Signed)
Discussed case with NP, patient is asymptomatic at this time, EKG is normal. Agree that she is appropriate for outpatient followup. See NP's note for details  Domenick Gong, MD  Luiz Blare, MD 10/25/11 2153

## 2011-10-26 ENCOUNTER — Encounter: Payer: Self-pay | Admitting: Family Medicine

## 2011-10-26 ENCOUNTER — Ambulatory Visit (INDEPENDENT_AMBULATORY_CARE_PROVIDER_SITE_OTHER): Payer: 59 | Admitting: Family Medicine

## 2011-10-26 VITALS — BP 98/68 | HR 88

## 2011-10-26 DIAGNOSIS — R079 Chest pain, unspecified: Secondary | ICD-10-CM

## 2011-10-26 DIAGNOSIS — R002 Palpitations: Secondary | ICD-10-CM

## 2011-10-26 LAB — CBC WITH DIFFERENTIAL/PLATELET
Eosinophils Absolute: 0.1 10*3/uL (ref 0.0–0.7)
HCT: 39.1 % (ref 36.0–46.0)
MCV: 91.2 fl (ref 78.0–100.0)
Monocytes Relative: 6.1 % (ref 3.0–12.0)
Neutro Abs: 1.4 10*3/uL (ref 1.4–7.7)
Platelets: 194 10*3/uL (ref 150.0–400.0)
RDW: 13.7 % (ref 11.5–14.6)

## 2011-10-26 LAB — D-DIMER, QUANTITATIVE: D-Dimer, Quant: 0.27 ug/mL-FEU (ref 0.00–0.48)

## 2011-10-26 LAB — BASIC METABOLIC PANEL
CO2: 30 mEq/L (ref 19–32)
Chloride: 101 mEq/L (ref 96–112)
Sodium: 138 mEq/L (ref 135–145)

## 2011-10-26 LAB — HEPATIC FUNCTION PANEL
ALT: 9 U/L (ref 0–35)
Albumin: 4 g/dL (ref 3.5–5.2)
Alkaline Phosphatase: 38 U/L — ABNORMAL LOW (ref 39–117)
Bilirubin, Direct: 0.1 mg/dL (ref 0.0–0.3)
Total Bilirubin: 0.4 mg/dL (ref 0.3–1.2)
Total Protein: 7.3 g/dL (ref 6.0–8.3)

## 2011-10-26 LAB — CARDIAC PANEL: Relative Index: 0.6 calc (ref 0.0–2.5)

## 2011-10-26 MED ORDER — ALPRAZOLAM 0.25 MG PO TABS: 0.2500 mg | ORAL_TABLET | Freq: Three times a day (TID) | ORAL | Status: AC | PRN

## 2011-10-26 NOTE — Addendum Note (Signed)
Addended by: Lelon Perla on: 10/26/2011 05:26 PM   Modules accepted: Orders

## 2011-10-26 NOTE — Patient Instructions (Addendum)
Chest Pain (Nonspecific) It is often hard to give a specific diagnosis for the cause of chest pain. There is always a chance that your pain could be related to something serious, such as a heart attack or a blood clot in the lungs. You need to follow up with your caregiver for further evaluation. CAUSES   Heartburn.   Pneumonia or bronchitis.   Anxiety or stress.   Inflammation around your heart (pericarditis) or lung (pleuritis or pleurisy).   A blood clot in the lung.   A collapsed lung (pneumothorax). It can develop suddenly on its own (spontaneous pneumothorax) or from injury (trauma) to the chest.   Shingles infection (herpes zoster virus).  The chest wall is composed of bones, muscles, and cartilage. Any of these can be the source of the pain.  The bones can be bruised by injury.   The muscles or cartilage can be strained by coughing or overwork.   The cartilage can be affected by inflammation and become sore (costochondritis).  DIAGNOSIS  Lab tests or other studies, such as X-rays, electrocardiography, stress testing, or cardiac imaging, may be needed to find the cause of your pain.  TREATMENT   Treatment depends on what may be causing your chest pain. Treatment may include:   Acid blockers for heartburn.   Anti-inflammatory medicine.   Pain medicine for inflammatory conditions.   Antibiotics if an infection is present.   You may be advised to change lifestyle habits. This includes stopping smoking and avoiding alcohol, caffeine, and chocolate.   You may be advised to keep your head raised (elevated) when sleeping. This reduces the chance of acid going backward from your stomach into your esophagus.   Most of the time, nonspecific chest pain will improve within 2 to 3 days with rest and mild pain medicine.  HOME CARE INSTRUCTIONS   If antibiotics were prescribed, take your antibiotics as directed. Finish them even if you start to feel better.   For the next few  days, avoid physical activities that bring on chest pain. Continue physical activities as directed.   Do not smoke.   Avoid drinking alcohol.   Only take over-the-counter or prescription medicine for pain, discomfort, or fever as directed by your caregiver.   Follow your caregiver's suggestions for further testing if your chest pain does not go away.   Keep any follow-up appointments you made. If you do not go to an appointment, you could develop lasting (chronic) problems with pain. If there is any problem keeping an appointment, you must call to reschedule.  SEEK MEDICAL CARE IF:   You think you are having problems from the medicine you are taking. Read your medicine instructions carefully.   Your chest pain does not go away, even after treatment.   You develop a rash with blisters on your chest.  SEEK IMMEDIATE MEDICAL CARE IF:   You have increased chest pain or pain that spreads to your arm, neck, jaw, back, or abdomen.   You develop shortness of breath, an increasing cough, or you are coughing up blood.   You have severe back or abdominal pain, feel nauseous, or vomit.   You develop severe weakness, fainting, or chills.   You have a fever.  THIS IS AN EMERGENCY. Do not wait to see if the pain will go away. Get medical help at once. Call your local emergency services (911 in [place] Do not drive yourself to the hospital. MAKE SURE YOU:   Understand these instructions.     Will watch your condition.   Will get help right away if you are not doing well or get worse.  Document Released: 01/28/2005 Document Revised: 04/09/2011 Document Reviewed: 11/24/2007 [employer] Patient Information 2012 [employer]. 

## 2011-10-26 NOTE — Progress Notes (Signed)
  cSubjective:    Anna [name] is a 31 y.o. female who presents with chest pain and palpitations. The symptoms are moderate, occur intermittently, and last a few seconds per episode. They tend to occur any time day or night. Cardiac risk factors include: ? her father had a heart problem but was murdered . Aggravating factors: exercise. Relieving factors: none. Associated symptoms: chest pain, dizziness, palpitations and shortness of breath. Patient denies: abdominal pain, calf pain, cough, fatigue, leg swelling, slow heart beat and syncope.  Pt states pain feels like something is sitting on her chest.  She was at [facility] Saturday.  The following portions of the patient's history were reviewed and updated as appropriate: allergies, current medications, past family history, past medical history, past social history, past surgical history and problem list.  Review of Systems Pertinent items are noted in HPI.   Objective:    BP 98/68  Pulse 88  SpO2 99% General appearance: alert, cooperative and no distress Lungs: clear to auscultation bilaterally Heart: S1, S2 normal Extremities: extremities normal, atraumatic, no cyanosis or edema  Cardiographics ECG: reviewed from UC   Assessment:    chest pain and palpitations with pvc   Plan:    check labs Refer to cardiology Follow up prn--go to ER if symptom of chest pain returns

## 2011-10-27 ENCOUNTER — Encounter: Payer: Self-pay | Admitting: Cardiology

## 2011-10-27 ENCOUNTER — Ambulatory Visit (INDEPENDENT_AMBULATORY_CARE_PROVIDER_SITE_OTHER): Payer: 59 | Admitting: Cardiology

## 2011-10-27 VITALS — BP 100/72 | HR 68 | Ht 62.0 in | Wt 135.0 lb

## 2011-10-27 DIAGNOSIS — I493 Ventricular premature depolarization: Secondary | ICD-10-CM | POA: Insufficient documentation

## 2011-10-27 DIAGNOSIS — I4949 Other premature depolarization: Secondary | ICD-10-CM

## 2011-10-27 NOTE — Progress Notes (Signed)
Anna [name] Date of Birth: 03/22/1981 Medical Record #[id number]  History of Present Illness: Anna [name] is seen at the request of Dr. Laury Axon for evaluation of palpitations. She is a very pleasant 31 year old Anna [name] female who reports that she has had palpitations for several years. 2 years ago she began experiencing an increased frequency of palpitations and was evaluated by [facility] cardiology. A 24-hour Holter monitor apparently demonstrated evidence of PVCs. The stress test was recommended but she never followed through. Afterwards her PVCs seem to go away for about 6 months. Since then she has had a steady increase in progression of her palpitations. She describes this as a fluttering sensation in her chest and sometimes like a water ball is being squeezed in her chest. She has no sustained tachycardia. Her symptoms are sporadic. There are no clear aggravating or alleviating factors. For the past 8 days she has experienced a sensation of heaviness in her chest that is constant and worse with movement. She feels that she just needs to take a deep breath. She does not drink caffeine. He does not use decongestants.  Current Outpatient Prescriptions on File Prior to Visit  Medication Sig Dispense Refill  . ALPRAZolam (XANAX) 0.25 MG tablet Take 1 tablet (0.25 mg total) by mouth 3 (three) times daily as needed for sleep.  30 tablet  0  . Probiotic Product (ALIGN PO) Take 1 tablet by mouth.        . valACYclovir (VALTREX) 500 MG tablet Take 1 tablet (500 mg total) by mouth daily.  100 tablet  0  . imipramine (TOFRANIL) 25 MG tablet Take 25 mg by mouth at bedtime.        Allergies  Allergen Reactions  . Aciphex (Rabeprazole Sodium)   . Latex     Past Medical History  Diagnosis Date  . GERD (gastroesophageal reflux disease)   . IBS (irritable bowel syndrome)   . PVC (premature ventricular contraction)   . Migraine     Past Surgical History  Procedure Date  . Wisdom tooth  extraction 2010  . Tubal ligation 06-04-2002    History  Smoking status  . Never Smoker   Smokeless tobacco  . Never Used    History  Alcohol Use No    Family History  Problem Relation Age of Onset  . Arthritis Maternal Grandmother   . Colon cancer Maternal Grandmother   . Hypertension Maternal Grandmother   . Diabetes Maternal Grandmother   . Arthritis Maternal Uncle   . Heart disease Maternal Uncle   . Ovarian cancer Maternal Aunt   . Prostate cancer Maternal Uncle   . Stroke Maternal Grandfather   . Kidney disease Maternal Grandfather   . Heart disease Maternal Grandfather   . Kidney failure Maternal Grandfather   . Diabetes Maternal Grandfather   . Diabetes Paternal Grandmother   . Bone cancer Paternal Grandmother     Review of Systems: The review of systems is positive for palpitations.  All other systems were reviewed and are negative.  Physical Exam: BP 100/72  Pulse 68  Ht 5\' 2"  (1.575 m)  Wt 135 lb (61.236 kg)  BMI 24.69 kg/m2 She is a pleasant young black female in no acute distress. Her HEENT exam is normal. Oropharynx is clear. Neck is supple without JVD, adenopathy, thyromegaly, or bruits. Lungs are clear. Cardiac exam reveals a regular rate and rhythm without gallop, murmur, or click. Abdomen is soft and nontender. She has no masses or bruits. Extremities are without  edema. Pulses are 2+ and symmetric. Skin is warm and dry. She is alert and oriented x3. Cranial nerves II through XII are intact.  LABORATORY DATA: ECG shows normal sinus rhythm with a normal ECG. Recent laboratory data including chemistries, CBC, thyroid function studies, d-dimer, and cardiac enzymes were all normal.  Assessment / Plan:

## 2011-10-27 NOTE — Patient Instructions (Signed)
We will obtain an Echocardiogram.

## 2011-10-27 NOTE — Assessment & Plan Note (Signed)
She has a known history of PVCs. Her recent palpitations are consistent with this. There are no clear aggravating factors. Her cardiac exam and ECG are normal. I suspect that the PVCs are benign. I recommended an echocardiogram to rule out structural heart disease. If her heart is structurally normal that we can reassure her that these are benign and do not necessarily require treatment. If so then I may prescribe her a beta blocker to use on a when necessary basis. We discussed avoidance of cardiac stimulants which she is already doing.

## 2011-10-29 ENCOUNTER — Telehealth: Payer: Self-pay | Admitting: Cardiology

## 2011-10-29 NOTE — Telephone Encounter (Signed)
Patient called wanted to make sure where holter monitor was done 2 years ago.Stated done at [facility] was called and a message was left to fax holter monitor done 2 yrs ago.

## 2011-10-29 NOTE — Telephone Encounter (Signed)
Follow up on previous call:  Can return call back to nurse.

## 2011-11-04 ENCOUNTER — Ambulatory Visit (HOSPITAL_COMMUNITY): Payer: 59 | Attending: Cardiology | Admitting: Radiology

## 2011-11-04 DIAGNOSIS — R002 Palpitations: Secondary | ICD-10-CM | POA: Insufficient documentation

## 2011-11-04 DIAGNOSIS — I059 Rheumatic mitral valve disease, unspecified: Secondary | ICD-10-CM | POA: Insufficient documentation

## 2011-11-04 DIAGNOSIS — I493 Ventricular premature depolarization: Secondary | ICD-10-CM

## 2011-11-04 DIAGNOSIS — R072 Precordial pain: Secondary | ICD-10-CM | POA: Insufficient documentation

## 2011-11-04 NOTE — Progress Notes (Signed)
Echocardiogram performed.  

## 2011-12-16 ENCOUNTER — Ambulatory Visit (INDEPENDENT_AMBULATORY_CARE_PROVIDER_SITE_OTHER): Payer: 59 | Admitting: Psychology

## 2011-12-16 DIAGNOSIS — F410 Panic disorder [episodic paroxysmal anxiety] without agoraphobia: Secondary | ICD-10-CM

## 2011-12-30 ENCOUNTER — Ambulatory Visit (INDEPENDENT_AMBULATORY_CARE_PROVIDER_SITE_OTHER): Payer: 59 | Admitting: Psychology

## 2011-12-30 DIAGNOSIS — F411 Generalized anxiety disorder: Secondary | ICD-10-CM

## 2012-01-13 ENCOUNTER — Ambulatory Visit (INDEPENDENT_AMBULATORY_CARE_PROVIDER_SITE_OTHER): Payer: 59 | Admitting: Psychology

## 2012-01-13 DIAGNOSIS — F411 Generalized anxiety disorder: Secondary | ICD-10-CM

## 2012-02-03 ENCOUNTER — Ambulatory Visit (INDEPENDENT_AMBULATORY_CARE_PROVIDER_SITE_OTHER): Payer: 59 | Admitting: Psychology

## 2012-02-03 DIAGNOSIS — F411 Generalized anxiety disorder: Secondary | ICD-10-CM

## 2012-02-17 ENCOUNTER — Ambulatory Visit: Payer: 59 | Admitting: Psychology

## 2012-02-29 ENCOUNTER — Other Ambulatory Visit: Payer: 59

## 2012-02-29 ENCOUNTER — Other Ambulatory Visit (INDEPENDENT_AMBULATORY_CARE_PROVIDER_SITE_OTHER): Payer: 59

## 2012-02-29 ENCOUNTER — Other Ambulatory Visit: Payer: Self-pay | Admitting: Family Medicine

## 2012-02-29 DIAGNOSIS — R300 Dysuria: Secondary | ICD-10-CM

## 2012-02-29 DIAGNOSIS — N390 Urinary tract infection, site not specified: Secondary | ICD-10-CM

## 2012-02-29 LAB — POCT URINALYSIS DIPSTICK
Nitrite, UA: NEGATIVE
Spec Grav, UA: 1.015
Urobilinogen, UA: 0.2

## 2012-02-29 MED ORDER — CIPROFLOXACIN HCL 500 MG PO TABS: 500.0000 mg | ORAL_TABLET | Freq: Two times a day (BID) | ORAL | Status: AC

## 2012-03-03 ENCOUNTER — Other Ambulatory Visit: Payer: Self-pay | Admitting: Family Medicine

## 2012-03-03 LAB — URINE CULTURE

## 2012-03-03 MED ORDER — FLUCONAZOLE 150 MG PO TABS: ORAL_TABLET | ORAL | Status: DC

## 2012-03-29 ENCOUNTER — Other Ambulatory Visit: Payer: Self-pay | Admitting: Family Medicine

## 2012-03-29 MED ORDER — DICYCLOMINE HCL 20 MG PO TABS: ORAL_TABLET | ORAL | Status: DC

## 2012-05-23 ENCOUNTER — Telehealth: Payer: Self-pay

## 2012-05-23 ENCOUNTER — Inpatient Hospital Stay (HOSPITAL_COMMUNITY)
Admission: AD | Admit: 2012-05-23 | Discharge: 2012-05-23 | Disposition: A | Discharge status: Home / Self Care | Payer: 59 | Source: Ambulatory Visit | Attending: Obstetrics and Gynecology | Admitting: Obstetrics and Gynecology

## 2012-05-23 ENCOUNTER — Inpatient Hospital Stay (HOSPITAL_COMMUNITY): Payer: 59

## 2012-05-23 ENCOUNTER — Encounter (HOSPITAL_COMMUNITY): Payer: Self-pay

## 2012-05-23 DIAGNOSIS — N72 Inflammatory disease of cervix uteri: Secondary | ICD-10-CM | POA: Insufficient documentation

## 2012-05-23 DIAGNOSIS — R1031 Right lower quadrant pain: Secondary | ICD-10-CM | POA: Insufficient documentation

## 2012-05-23 LAB — URINALYSIS, ROUTINE W REFLEX MICROSCOPIC
Leukocytes, UA: NEGATIVE
Nitrite: NEGATIVE
Specific Gravity, Urine: >1.03 — ABNORMAL HIGH (ref 1.005–1.030)
Urobilinogen, UA: 0.2 mg/dL (ref 0.0–1.0)

## 2012-05-23 LAB — CBC WITH DIFFERENTIAL/PLATELET
Basophils Absolute: 0 10*3/uL (ref 0.0–0.1)
Eosinophils Absolute: 0.1 10*3/uL (ref 0.0–0.7)
Eosinophils Relative: 3 % (ref 0–5)
Lymphocytes Relative: 45 % (ref 12–46)
Lymphs Abs: 1.5 10*3/uL (ref 0.7–4.0)
MCV: 88.2 fL (ref 78.0–100.0)
Neutrophils Relative %: 44 % (ref 43–77)
Platelets: 200 10*3/uL (ref 150–400)
RBC: 4.07 MIL/uL (ref 3.87–5.11)
RDW: 12.8 % (ref 11.5–15.5)
WBC: 3.3 10*3/uL — ABNORMAL LOW (ref 4.0–10.5)

## 2012-05-23 LAB — URINE MICROSCOPIC-ADD ON

## 2012-05-23 MED ORDER — IBUPROFEN 600 MG PO TABS: 600.0000 mg | ORAL_TABLET | Freq: Four times a day (QID) | ORAL | Status: DC | PRN

## 2012-05-23 NOTE — Progress Notes (Addendum)
History  Anna [name] is a 32 y.o. G2P2002 at Unknown   Chief Complaint  Patient presents with  . Abdominal Pain  woken up out of my sleep about 445 from the pain. States she is having a little nausea but denies any vomiting, no diarrhea, had bm yesterday, no menses, no new sex;ual partners.    Unknown   Chief Complaint  Patient presents with  . Abdominal Pain   @SFHPI @  Prior to Admission medications   Medication Sig Start Date End Date Taking? Authorizing Provider  dicyclomine (BENTYL) 20 MG tablet TID prn for IBS 03/29/12  Yes Sheliah Hatch, MD  Probiotic Product (ALIGN PO) Take 1 tablet by mouth.     Yes Historical Provider, MD  fluconazole (DIFLUCAN) 150 MG tablet As directed 03/03/12   Lelon Perla, DO  imipramine (TOFRANIL) 25 MG tablet Take 25 mg by mouth at bedtime.    Historical Provider, MD  valACYclovir (VALTREX) 500 MG tablet Take 1 tablet (500 mg total) by mouth daily. 09/24/11 09/23/12  Kirkland Hun, MD    Patient Active Problem List  Diagnosis  . Knee pain, left  . Irritable bowel syndrome  . Headache  . PVC's (premature ventricular contractions)   Vitals:  Blood pressure 115/69, pulse 65, temperature 98.6 F (37 C), temperature source Oral, resp. rate 18, height 5' 2.5" (1.588 m), weight 134 lb (60.782 kg), SpO2 100.00%. OB History    Grav Para Term Preterm Abortions TAB SAB Ect Mult Living   2 2 2       2       Past Medical History  Diagnosis Date  . GERD (gastroesophageal reflux disease)   . IBS (irritable bowel syndrome)   . PVC (premature ventricular contraction)   . Migraine     Past Surgical History  Procedure Date  . Wisdom tooth extraction 2010  . Tubal ligation 06-04-2002  . Endometrial ablation     Family History  Problem Relation Age of Onset  . Arthritis Maternal Grandmother   . Colon cancer Maternal Grandmother   . Hypertension Maternal Grandmother   . Diabetes Maternal Grandmother   . Arthritis Maternal Uncle   .  Heart disease Maternal Uncle   . Ovarian cancer Maternal Aunt   . Prostate cancer Maternal Uncle   . Stroke Maternal Grandfather   . Kidney disease Maternal Grandfather   . Heart disease Maternal Grandfather   . Kidney failure Maternal Grandfather   . Diabetes Maternal Grandfather   . Diabetes Paternal Grandmother   . Bone cancer Paternal Grandmother     History  Substance Use Topics  . Smoking status: Never Smoker   . Smokeless tobacco: Never Used  . Alcohol Use: No    Allergies:  Allergies  Allergen Reactions  . Aciphex (Rabeprazole Sodium)   . Latex     Prescriptions prior to admission  Medication Sig Dispense Refill  . dicyclomine (BENTYL) 20 MG tablet TID prn for IBS  90 tablet  1  . Probiotic Product (ALIGN PO) Take 1 tablet by mouth.        . fluconazole (DIFLUCAN) 150 MG tablet As directed  30 tablet  1  . imipramine (TOFRANIL) 25 MG tablet Take 25 mg by mouth at bedtime.      . valACYclovir (VALTREX) 500 MG tablet Take 1 tablet (500 mg total) by mouth daily.  100 tablet  0    @ROS @ Physical Exam   Blood pressure 115/69, pulse 65, temperature 98.6 F (37  C), temperature source Oral, resp. rate 18, height 5' 2.5" (1.588 m), weight 134 lb (60.782 kg), SpO2 100.00%.  @PHYSEXAMBYAGE2 @ Labs:  No results found for this or any previous visit (from the past 24 hour(s)). ASSESSMENT: Patient Active Problem List  Diagnosis  . Knee pain, left  . Irritable bowel syndrome  . Headache  . PVC's (premature ventricular contractions)   Physical Examination: General appearance - alert, well appearing, and in no distress Physical exam: Calm, no distress, HEENT wnl lungs clear bilaterally, AP RRR, abd soft, gravid, nt, bowel sounds active, abdomen nontender no rebound tenderness Normal hair distrubition mons pubis,  EGBUS WNL, sterile speculum exam,  vagina pink, moist normal rugae,  Vag LTC no CMT No edema to lower extremities  ED Course  Assessment/Plan RLQ pain ua  pg test, ua, cbc, gc,chl, pelvic US report given to Dr. Su Hilt per telephone. Lavera Guise, CNM      Report from RN: pain is much better without any pain medication 4/10. Ultrasound is normal except for expected changes 2nd to endometrial ablation. CBC all WNL.  Instructed to D/C patient home with Ibuprofen 600 mg every 6 hours for 24 hours then PRN and to call office to follow-up with her gyn for long term plan.

## 2012-05-23 NOTE — MAU Note (Signed)
Pt states that she was woken up about 445 from the pain. States she is having a little nausea but denies any vomiting.

## 2012-05-23 NOTE — Progress Notes (Signed)
MD informed of U/S & lab results, is entering DC order.

## 2012-05-23 NOTE — Telephone Encounter (Signed)
Tc from pt. Pt seen at [facility] today for RLQ pain. Pt needs follow up with AVS per SR. Appt sched 06/08/12 @ 3:15 with AVS for eval. Pt agrees.

## 2012-06-08 ENCOUNTER — Encounter: Payer: Self-pay | Admitting: Obstetrics and Gynecology

## 2012-06-08 ENCOUNTER — Ambulatory Visit: Payer: 59 | Admitting: Obstetrics and Gynecology

## 2012-06-08 VITALS — BP 102/60 | Temp 98.6°F | Wt 134.0 lb

## 2012-06-08 DIAGNOSIS — R109 Unspecified abdominal pain: Secondary | ICD-10-CM

## 2012-06-08 NOTE — Progress Notes (Signed)
HISTORY OF PRESENT ILLNESS  [name] is a 32 y.o. year old female,G2P2002, who presents for a problem visit. The patient was seen in the emergency department in January 2014 because of abdominal pain area initially the pain was on the left and then on the right.  Her white blood cell count was normal.  Gonorrhea and Chlamydia tests were negative.  A pelvic ultrasound was performed and she was found to have normal female organs.  The patient is not sexually active.  She has had an endometrial ablation in the past.  She has a history of irritable bowel syndrome. A urinalysis was negative.  No urine culture was performed.  The patient had a Klebsiella urinary tract infection in October 2013.  Subjective:  The patient feels somewhat better at this time. She denies diarrhea, and rectal bleeding.  No GU complaints.  No fever or chills. The patient complains of a tender bone on the left labia.  Objective:  BP 102/60  Temp 98.6 F (37 C) (Oral)  Wt 134 lb (60.782 kg)   General: no distress GI: soft and nontender  External genitalia: normal general appearance. Tender inclusion cysts on the left labia minora. Vaginal: normal without tenderness, induration or masses and relaxation noted Cervix: normal appearance Adnexa: normal bimanual exam Uterus: upper limits normal size  Assessment:  Abdominal pain of uncertain etiology  Urinary tract infection in October 2013  History of irritable bowel syndrome  Tender inclusion cyst on the left labia minora  Plan:  Urine culture  Heat and observation.  Call if inclusion cyst does not improve.  Return to office prn if symptoms worsen or fail to improve. Return for annual exam.  Leonard Schwartz M.D.  06/08/2012 6:14 PM

## 2012-06-13 ENCOUNTER — Other Ambulatory Visit: Payer: 59

## 2012-06-13 ENCOUNTER — Telehealth: Payer: Self-pay

## 2012-06-13 ENCOUNTER — Other Ambulatory Visit: Payer: Self-pay

## 2012-06-13 DIAGNOSIS — R109 Unspecified abdominal pain: Secondary | ICD-10-CM

## 2012-06-13 MED ORDER — METRONIDAZOLE 500 MG PO TABS: 500.0000 mg | ORAL_TABLET | Freq: Two times a day (BID) | ORAL | Status: AC

## 2012-06-13 NOTE — Telephone Encounter (Signed)
Tc from pt. Pt c/o slight vaginal odor without irritation. Pt would like rx sent to pharm if not, pt will come in for eval. Consulted with AVS, pt may have MTZ. Rx for MTZ e-pres to pharm on file. Pt agrees

## 2012-06-15 LAB — URINE CULTURE: Colony Count: 25000

## 2012-09-14 ENCOUNTER — Other Ambulatory Visit: Payer: Self-pay | Admitting: Family Medicine

## 2012-09-14 ENCOUNTER — Encounter: Payer: Self-pay | Admitting: Gastroenterology

## 2012-09-14 DIAGNOSIS — K589 Irritable bowel syndrome without diarrhea: Secondary | ICD-10-CM

## 2012-09-16 ENCOUNTER — Other Ambulatory Visit (INDEPENDENT_AMBULATORY_CARE_PROVIDER_SITE_OTHER): Payer: 59

## 2012-09-16 ENCOUNTER — Encounter: Payer: Self-pay | Admitting: Gastroenterology

## 2012-09-16 ENCOUNTER — Ambulatory Visit (INDEPENDENT_AMBULATORY_CARE_PROVIDER_SITE_OTHER): Payer: 59 | Admitting: Gastroenterology

## 2012-09-16 VITALS — BP 90/60 | HR 72 | Ht 63.0 in | Wt 133.5 lb

## 2012-09-16 DIAGNOSIS — K5900 Constipation, unspecified: Secondary | ICD-10-CM

## 2012-09-16 DIAGNOSIS — K5989 Other specified functional intestinal disorders: Secondary | ICD-10-CM

## 2012-09-16 DIAGNOSIS — Z800 Family history of malignant neoplasm of digestive organs: Secondary | ICD-10-CM

## 2012-09-16 DIAGNOSIS — R1013 Epigastric pain: Secondary | ICD-10-CM

## 2012-09-16 DIAGNOSIS — K219 Gastro-esophageal reflux disease without esophagitis: Secondary | ICD-10-CM

## 2012-09-16 DIAGNOSIS — K599 Functional intestinal disorder, unspecified: Secondary | ICD-10-CM

## 2012-09-16 LAB — CBC WITH DIFFERENTIAL/PLATELET
Basophils Absolute: 0 10*3/uL (ref 0.0–0.1)
Eosinophils Absolute: 0.1 10*3/uL (ref 0.0–0.7)
Eosinophils Relative: 2.8 % (ref 0.0–5.0)
HCT: 41 % (ref 36.0–46.0)
Lymphs Abs: 0.8 10*3/uL (ref 0.7–4.0)
MCHC: 34.4 g/dL (ref 30.0–36.0)
MCV: 86.9 fl (ref 78.0–100.0)
Monocytes Absolute: 0.3 10*3/uL (ref 0.1–1.0)
Neutrophils Relative %: 51.5 % (ref 43.0–77.0)
Platelets: 193 10*3/uL (ref 150.0–400.0)
RDW: 13.7 % (ref 11.5–14.6)

## 2012-09-16 LAB — IBC PANEL
Saturation Ratios: 5.9 % — ABNORMAL LOW (ref 20.0–50.0)
Transferrin: 217.9 mg/dL (ref 212.0–360.0)

## 2012-09-16 LAB — FOLATE: Folate: 18.2 ng/mL (ref >5.9)

## 2012-09-16 LAB — COMPREHENSIVE METABOLIC PANEL
AST: 23 U/L (ref 0–37)
Alkaline Phosphatase: 43 U/L (ref 39–117)
Glucose, Bld: 79 mg/dL (ref 70–99)
Sodium: 136 mEq/L (ref 135–145)
Total Bilirubin: 0.5 mg/dL (ref 0.3–1.2)
Total Protein: 7.2 g/dL (ref 6.0–8.3)

## 2012-09-16 LAB — VITAMIN B12: Vitamin B-12: 153 pg/mL — ABNORMAL LOW (ref 211–911)

## 2012-09-16 LAB — HEPATIC FUNCTION PANEL
Bilirubin, Direct: 0.1 mg/dL (ref 0.0–0.3)
Total Bilirubin: 0.5 mg/dL (ref 0.3–1.2)

## 2012-09-16 MED ORDER — MOVIPREP 100 G PO SOLR: 1.0000 | Freq: Once | ORAL | Status: DC

## 2012-09-16 MED ORDER — DICYCLOMINE HCL 10 MG PO CAPS: 10.0000 mg | ORAL_CAPSULE | Freq: Three times a day (TID) | ORAL | Status: DC

## 2012-09-16 MED ORDER — ESOMEPRAZOLE MAGNESIUM 40 MG PO CPDR: 40.0000 mg | DELAYED_RELEASE_CAPSULE | Freq: Every day | ORAL | Status: DC

## 2012-09-16 NOTE — Progress Notes (Signed)
History of Present Illness:  This is a very nice 32 year old African American female who has had at least 5 years of constipation predominant IBS with probable mild gastroparesis.  She previously had been under the care of Dr. Sabino Gasser , and apparently had a normal endoscopy and gastric emptying scan in 2010.  She currently may get as many as one to 2 weeks without a bowel movement, then developed abdominal gas, bloating, cramping, and general malaise.  She recently has been very constipated and took a sample of Linzess with resultant diarrhea and abdominal cramping.  She also does use a fair amount of sorbitol with by mouth gum.  Patient also has lactose intolerance.  She's had mild anorexia and a 7 pound weight loss, but denies fever, chills, or hepatobiliary complaints.  Patient also describes recurrent epigastric abdominal pain with acid reflux symptoms improved by when necessary Nexium use.  There is no history of dysphagia, melena, hematochezia, or gynecologic problems.  She's had previous endometrial ablation.  Family history remarkable for colon cancer in her grandmother.  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  She denies bladder emptying problems, or other neuromuscular symptoms.  There is no history of Raynaud's phenomenon, or other symptoms of collagen vascular disease.   I have reviewed this patient's present history, medical and surgical past history, allergies and medications.     ROS:   All systems were reviewed and are negative unless otherwise stated in the HPI.    Physical Exam: Healthy-appearing patient in no distress.  Blood pressure 90/60, pulse 72 and regular, and weight 133 with a BMI of 23.65. General well developed well nourished patient in no acute distress, appearing their stated age Eyes PERRLA, no icterus, fundoscopic exam per opthamologist Skin no lesions noted Neck supple, no adenopathy, no thyroid  enlargement, no tenderness Chest clear to percussion and auscultation Heart no significant murmurs, gallops or rubs noted Abdomen no hepatosplenomegaly masses or tenderness, BS normal.  Rectal inspection normal no fissures, or fistulae noted.  No masses or tenderness on digital exam. Stool guaiac negative.  Stool very loose and yellow in color. Extremities no acute joint lesions, edema, phlebitis or evidence of cellulitis. Neurologic patient oriented x 3, cranial nerves intact, no focal neurologic deficits noted. Psychological mental status normal and normal affect.  Assessment and plan: Probable underlying GI motility disorder with severe colonic atony and possible bacterial overgrowth syndrome, rule out celiac disease, IBD.  I've scheduled her for colonoscopy exam, screening labs including celiac serologies, serum carotene, sedimentation rate and CRP.  She has dyspeptic symptoms and acid reflux, and I have also scheduled endoscopy with biopsies for H. pylori and celiac disease.  She's been placed on Nexium 40 mg a day, and Bentyl 10 mg every 6-8 hours.  I placed her on a FOD-MAP  For IBS with avoidance of sorbitol and fructose, asked her not to use Linzess or Amitza until her workup has been completed.  She may need a high-resolution esophageal manometry for better definition of her GI motility.  Previous emptying scan 2010 with surprisingly normal.  Lab review does show previous normal thyroid function test and chronic benign leukopenia.  As mentioned above she does have a family history of colon cancer in her grandmother.  No diagnosis found.

## 2012-09-16 NOTE — Patient Instructions (Addendum)
You have been scheduled for an endoscopy and colonoscopy with propofol. Please follow the written instructions given to you at your visit today. Please pick up your prep at the pharmacy within the next 1-3 days. If you use inhalers (even only as needed), please bring them with you on the day of your procedure. Your physician has requested that you go to www.startemmi.com and enter the access code given to you at your visit today. This web site gives a general overview about your procedure. However, you should still follow specific instructions given to you by our office regarding your preparation for the procedure.  We have sent the following medications to your pharmacy for you to pick up at your convenience: Bentyl, please take as directed. Nexium, please take one capsule by mouth once daily.  Your physician has requested that you go to the basement for lab work before leaving today.   Please stop all dairy.  Please follow FODMAP diet and Artificial Sweetners Diet given today.

## 2012-09-19 ENCOUNTER — Ambulatory Visit (AMBULATORY_SURGERY_CENTER): Payer: 59 | Admitting: Gastroenterology

## 2012-09-19 ENCOUNTER — Other Ambulatory Visit: Payer: Self-pay | Admitting: *Deleted

## 2012-09-19 ENCOUNTER — Encounter: Payer: Self-pay | Admitting: Gastroenterology

## 2012-09-19 VITALS — BP 95/58 | HR 75 | Temp 96.5°F | Resp 16

## 2012-09-19 DIAGNOSIS — D1330 Benign neoplasm of unspecified part of small intestine: Secondary | ICD-10-CM

## 2012-09-19 DIAGNOSIS — K5900 Constipation, unspecified: Secondary | ICD-10-CM

## 2012-09-19 DIAGNOSIS — K589 Irritable bowel syndrome without diarrhea: Secondary | ICD-10-CM

## 2012-09-19 DIAGNOSIS — R1013 Epigastric pain: Secondary | ICD-10-CM

## 2012-09-19 DIAGNOSIS — K219 Gastro-esophageal reflux disease without esophagitis: Secondary | ICD-10-CM

## 2012-09-19 DIAGNOSIS — Z800 Family history of malignant neoplasm of digestive organs: Secondary | ICD-10-CM

## 2012-09-19 DIAGNOSIS — Z1211 Encounter for screening for malignant neoplasm of colon: Secondary | ICD-10-CM

## 2012-09-19 MED ORDER — SODIUM CHLORIDE 0.9 % IV SOLN: 500.0000 mL | INTRAVENOUS | Status: DC

## 2012-09-19 MED ORDER — CYANOCOBALAMIN 1000 MCG/ML IJ SOLN: INTRAMUSCULAR | Status: DC

## 2012-09-19 NOTE — Patient Instructions (Addendum)
Follow up appointment with Dr. Jarold Motto October 06, 2012 at 845 am.  Start Amitiza 8 mcg twice daily.   YOU HAD AN ENDOSCOPIC PROCEDURE TODAY AT THE [facility]: Refer to the procedure report that was given to you for any specific questions about what was found during the examination.  If the procedure report does not answer your questions, please call your gastroenterologist to clarify.  If you requested that your care partner not be given the details of your procedure findings, then the procedure report has been included in a sealed envelope for you to review at your convenience later.  YOU SHOULD EXPECT: Some feelings of bloating in the abdomen. Passage of more gas than usual.  Walking can help get rid of the air that was put into your GI tract during the procedure and reduce the bloating. If you had a lower endoscopy (such as a colonoscopy or flexible sigmoidoscopy) you may notice spotting of blood in your stool or on the toilet paper. If you underwent a bowel prep for your procedure, then you may not have a normal bowel movement for a few days.  DIET: Your first meal following the procedure should be a light meal and then it is ok to progress to your normal diet.  A half-sandwich or bowl of soup is an example of a good first meal.  Heavy or fried foods are harder to digest and may make you feel nauseous or bloated.  Likewise meals heavy in dairy and vegetables can cause extra gas to form and this can also increase the bloating.  Drink plenty of fluids but you should avoid alcoholic beverages for 24 hours.  ACTIVITY: Your care partner should take you home directly after the procedure.  You should plan to take it easy, moving slowly for the rest of the day.  You can resume normal activity the day after the procedure however you should NOT DRIVE or use heavy machinery for 24 hours (because of the sedation medicines used during the test).    SYMPTOMS TO REPORT IMMEDIATELY: A  gastroenterologist can be reached at any hour.  During normal business hours, 8:30 AM to 5:00 PM Monday through Friday, call [phone].  After hours and on weekends, please call the GI answering service at [phone] who will take a message and have the physician on call contact you.   Following lower endoscopy (colonoscopy or flexible sigmoidoscopy):  Excessive amounts of blood in the stool  Significant tenderness or worsening of abdominal pains  Swelling of the abdomen that is new, acute  Fever of 100F or higher  Following upper endoscopy (EGD)  Vomiting of blood or coffee ground material  New chest pain or pain under the shoulder blades  Painful or persistently difficult swallowing  New shortness of breath  Fever of 100F or higher  Black, tarry-looking stools  FOLLOW UP: If any biopsies were taken you will be contacted by phone or by letter within the next 1-3 weeks.  Call your gastroenterologist if you have not heard about the biopsies in 3 weeks.  Our staff will call the home number listed on your records the next business day following your procedure to check on you and address any questions or concerns that you may have at that time regarding the information given to you following your procedure. This is a courtesy call and so if there is no answer at the home number and we have not heard from you through the emergency physician on  call, we will assume that you have returned to your regular daily activities without incident.  SIGNATURES/CONFIDENTIALITY: You and/or your care partner have signed paperwork which will be entered into your electronic medical record.  These signatures attest to the fact that that the information above on your After Visit Summary has been reviewed and is understood.  Full responsibility of the confidentiality of this discharge information lies with you and/or your care-partner.

## 2012-09-19 NOTE — Progress Notes (Signed)
Patient did not experience any of the following events: a burn prior to discharge; a fall within the facility; wrong site/side/patient/procedure/implant event; or a hospital transfer or hospital admission upon discharge from the facility. (G8907) Patient did not have preoperative order for IV antibiotic SSI prophylaxis. (G8918)  

## 2012-09-19 NOTE — Op Note (Signed)
[facility] [address] [place], [address]   ENDOSCOPY PROCEDURE REPORT  PATIENT: Anna [name], Anna [name]  MR#: [id number] BIRTHDATE: [birth year] , 31  yrs. old GENDER: Female ENDOSCOPIST:Tannen Vandezande Hale Bogus, MD, [facility] REFERRED BY: Loreen Freud, DO PROCEDURE DATE:  09/19/2012 PROCEDURE:   EGD w/ biopsy and EGD w/ biopsy for H.pylori ASA CLASS:    Class II INDICATIONS: Dyspepsia, Epigastric pain, and Anemia. MEDICATION: There was residual sedation effect present from prior procedure and Propofol (Diprivan) 110 mg IV TOPICAL ANESTHETIC:   Cetacaine Spray  DESCRIPTION OF PROCEDURE:   After the risks and benefits of the procedure were explained, informed consent was obtained.  The endoscope was introduced through the mouth  and advanced to the second portion of the duodenum .  The instrument was slowly withdrawn as the mucosa was fully examined.      DUODENUM: The duodenal mucosa showed no abnormalities in the bulb and second portion of the duodenum.  Cold forcep biopsies were taken in the second portion.  STOMACH: The mucosa of the stomach appeared normal.  A CLO  biopsy was performed.  ESOPHAGUS: The mucosa of the esophagus appeared normal. Retroflexed views revealed no abnormalities.    The scope was then withdrawn from the patient and the procedure completed.  COMPLICATIONS: There were no complications.   ENDOSCOPIC IMPRESSION: 1.   The duodenal mucosa showed no abnormalities in the bulb and second portion of the duodenum ..r/o celiac disease. 2.   The mucosa of the stomach appeared normal; biopsy ,Bx. for H.pylori done. 3.   The mucosa of the esophagus appeared normal  RECOMMENDATIONS: 1.  Await biopsy results 2.  Continue current medications    _______________________________ eSigned:  Mardella Layman, MD, [facility] 09/19/2012 2:38 PM   standard discharge

## 2012-09-19 NOTE — Op Note (Signed)
[facility] [address] [place], [address]   COLONOSCOPY PROCEDURE REPORT  PATIENT: Anna [name], Anna [name]  MR#: [id number] BIRTHDATE: 08-17-1980 , 31  yrs. old GENDER: Female ENDOSCOPIST: Mardella Layman, MD, [facility] REFERRED BY:  Loreen Freud, DO PROCEDURE DATE:  09/19/2012 PROCEDURE:   Colonoscopy, screening ASA CLASS:   Class II INDICATIONS:Average risk patient for colon cancer, Bloating, Constipation, and Abdominal pain. MEDICATIONS: Propofol (Diprivan) 290 mg IV  DESCRIPTION OF PROCEDURE:   After the risks and benefits and of the procedure were explained, informed consent was obtained.  A digital rectal exam revealed no abnormalities of the rectum.    The endoscope was introduced through the anus and advanced to the cecum, which was identified by both the appendix and ileocecal valve .  The quality of the prep was excellent, using MoviPrep . The instrument was then slowly withdrawn as the colon was fully examined.     COLON FINDINGS: A normal appearing cecum, ileocecal valve, and appendiceal orifice were identified.  The ascending, hepatic flexure, transverse, splenic flexure, descending, sigmoid colon and rectum appeared unremarkable.  No polyps or cancers were seen. Retroflexed views revealed no abnormalities.     The scope was then withdrawn from the patient and the procedure completed.  COMPLICATIONS: There were no complications. ENDOSCOPIC IMPRESSION: Normal colon .Marland Kitchenno IBD,obstruction,inflammation,etc.  RECOMMENDATIONS: Continue current medications,,Add amitiza 8 mcg bid to regime.   REPEAT EXAM:  cc:  _______________________________ eSignedMardella Layman, MD, [facility] 09/19/2012 2:24 PM

## 2012-09-20 ENCOUNTER — Telehealth: Payer: Self-pay

## 2012-09-20 NOTE — Telephone Encounter (Signed)
  Follow up Call-  Call back number 09/19/2012  Post procedure Call Back phone  # [phone] cell  Permission to leave phone message Yes     Patient questions:  Do you have a fever, pain , or abdominal swelling? no Pain Score  0 *  Have you tolerated food without any problems? yes  Have you been able to return to your normal activities? yes  Do you have any questions about your discharge instructions: Diet   no Medications  no Follow up visit  no  Do you have questions or concerns about your Care? no  Actions: * If pain score is 4 or above: No action needed, pain <4.

## 2012-09-23 ENCOUNTER — Encounter: Payer: Self-pay | Admitting: Gastroenterology

## 2012-09-26 LAB — CELIAC PANEL 10
Endomysial Screen: NEGATIVE
Gliadin IgA: 4.9 U/mL (ref <20)
Gliadin IgG: 9 U/mL (ref <20)
IgA: 119 mg/dL (ref 69–380)
Tissue Transglut Ab: 4.3 U/mL (ref <20)

## 2012-09-27 ENCOUNTER — Ambulatory Visit: Payer: 59 | Admitting: Gastroenterology

## 2012-10-06 ENCOUNTER — Ambulatory Visit (INDEPENDENT_AMBULATORY_CARE_PROVIDER_SITE_OTHER): Payer: 59 | Admitting: Gastroenterology

## 2012-10-06 ENCOUNTER — Encounter: Payer: Self-pay | Admitting: Gastroenterology

## 2012-10-06 VITALS — BP 100/60 | HR 68 | Ht 63.0 in | Wt 134.2 lb

## 2012-10-06 DIAGNOSIS — R110 Nausea: Secondary | ICD-10-CM

## 2012-10-06 DIAGNOSIS — K589 Irritable bowel syndrome without diarrhea: Secondary | ICD-10-CM

## 2012-10-06 DIAGNOSIS — D649 Anemia, unspecified: Secondary | ICD-10-CM

## 2012-10-06 DIAGNOSIS — K5900 Constipation, unspecified: Secondary | ICD-10-CM

## 2012-10-06 DIAGNOSIS — K219 Gastro-esophageal reflux disease without esophagitis: Secondary | ICD-10-CM

## 2012-10-06 NOTE — Progress Notes (Signed)
This is a nice 32 year old African American female who continues to have symptoms of delayed gastric emptying.  Her acid reflux symptoms are controlled with daily PPI, and her constipation is much better on by mouth Amitiza.  She continues to have nausea and eats frequent small meals but denies emesis, dysphagia, or any other neuromuscular symptoms, bladder emptying problems, or neurological problems.  Gastric emptying scan in 2002 was normal.  Recent endoscopy and colonoscopy were unremarkable including small bowel biopsy.  She was found to be both B12 and iron deficient and is on replacement therapy.  She had endometrial ablation has not had any menstruation in over 4 years.  Current Medications, Allergies, Past Medical History, Past Surgical History, Family History and Social History were reviewed in [employer] record.  ROS: All systems were reviewed and are negative unless otherwise stated in the HPI.          Physical Exam: LT appearing patient in no distress.  Blood pressure 100/60, pulse 68, and weight 134 with a BMI of 23.78.  Physical exam not performed    Assessment and Plan: Her symptoms are still most consistent with delayed gastric emptying, and I have decided to repeat her gastric emptying scan.  She may benefit from a mild prokinetic agent.  For now, we will continue Nexium 40 mg a day and Amitiza 8 mcg twice a day with B12 and iron replacement.  She is to come in for repeat CBC and anemia profile in 2 months. Encounter Diagnosis  Name Primary?  Marland Kitchen Anemia Yes

## 2012-10-06 NOTE — Patient Instructions (Addendum)
  Your physician has requested that you go to the basement on December 05, 2012 for lab work.  Lab is open Monday-Friday 7:30 am to 5:30 pm.  You have been scheduled for a gastric emptying scan at [facility]  on 10-19-2012 at 1 pm. Please arrive at least 15 minutes prior to your appointment for registration. Please make certain not to have anything to eat or drink after midnight the night before your test. Hold all stomach medications (ex: Zofran, phenergan, Reglan) 48 hours prior to your test. If you need to reschedule your appointment, please contact radiology scheduling at [phone]. _____________________________________________________________________ A gastric-emptying study measures how long it takes for food to move through your stomach. There are several ways to measure stomach emptying. In the most common test, you eat food that contains a small amount of radioactive material. A scanner that detects the movement of the radioactive material is placed over your abdomen to monitor the rate at which food leaves your stomach. This test normally takes about 2 hours to complete. _____________________________________________________________________                                               We are excited to introduce MyChart, a new best-in-class service that provides you online access to important information in your electronic medical record. We want to make it easier for you to view your health information - all in one secure location - when and where you need it. We expect MyChart will enhance the quality of care and service we provide.  When you register for MyChart, you can:    View your test results.    Request appointments and receive appointment reminders via email.    Request medication renewals.    View your medical history, allergies, medications and immunizations.    Communicate with your physician's office through a password-protected site.    Conveniently print  information such as your medication lists.  To find out if MyChart is right for you, please talk to a member of our clinical staff today. We will gladly answer your questions about this free health and wellness tool.  If you are age 98 or older and want a member of your family to have access to your record, you must provide written consent by completing a proxy form available at our office. Please speak to our clinical staff about guidelines regarding accounts for patients younger than age 72.  As you activate your MyChart account and need any technical assistance, please call the MyChart technical support line at [phone]-CHART [phone]) or email your question to [email or web address]. If you email your question(s), please include your name, a return phone number and the best time to reach you.  If you have non-urgent health-related questions, you can send a message to our office through MyChart at [email or web address]. If you have a medical emergency, call 911.  Thank you for using MyChart as your new health and wellness resource!   MyChart licensed from [employer],  [id number]. Patents Pending.

## 2012-10-19 ENCOUNTER — Ambulatory Visit (HOSPITAL_COMMUNITY): Payer: 59

## 2012-10-25 ENCOUNTER — Other Ambulatory Visit (INDEPENDENT_AMBULATORY_CARE_PROVIDER_SITE_OTHER): Payer: 59

## 2012-10-25 DIAGNOSIS — D649 Anemia, unspecified: Secondary | ICD-10-CM

## 2012-10-25 LAB — CBC WITH DIFFERENTIAL/PLATELET
Basophils Absolute: 0 10*3/uL (ref 0.0–0.1)
Basophils Relative: 0.4 % (ref 0.0–3.0)
Hemoglobin: 12.4 g/dL (ref 12.0–15.0)
Lymphocytes Relative: 45.2 % (ref 12.0–46.0)
Monocytes Relative: 5.8 % (ref 3.0–12.0)
Neutro Abs: 1.7 10*3/uL (ref 1.4–7.7)
Neutrophils Relative %: 45.3 % (ref 43.0–77.0)
RBC: 4.1 Mil/uL (ref 3.87–5.11)
RDW: 14.1 % (ref 11.5–14.6)

## 2012-10-25 LAB — IBC PANEL: Transferrin: 209.3 mg/dL — ABNORMAL LOW (ref 212.0–360.0)

## 2012-10-25 LAB — FERRITIN: Ferritin: 66.9 ng/mL (ref 10.0–291.0)

## 2012-10-25 LAB — FOLATE: Folate: 10.4 ng/mL (ref >5.9)

## 2012-11-07 ENCOUNTER — Other Ambulatory Visit: Payer: Self-pay | Admitting: *Deleted

## 2012-11-07 MED ORDER — CYANOCOBALAMIN 1000 MCG/ML IJ SOLN: INTRAMUSCULAR | Status: DC

## 2013-03-10 ENCOUNTER — Other Ambulatory Visit: Payer: Self-pay | Admitting: *Deleted

## 2013-03-10 MED ORDER — VALACYCLOVIR HCL 1 G PO TABS: 1000.0000 mg | ORAL_TABLET | Freq: Every day | ORAL | Status: DC

## 2013-05-25 ENCOUNTER — Other Ambulatory Visit: Payer: Self-pay | Admitting: General Practice

## 2013-05-25 MED ORDER — DEXTROMETHORPHAN POLISTIREX 30 MG/5ML PO LQCR: 60.0000 mg | ORAL | Status: DC | PRN

## 2013-06-23 ENCOUNTER — Other Ambulatory Visit: Payer: Self-pay | Admitting: Family Medicine

## 2013-06-23 DIAGNOSIS — N644 Mastodynia: Secondary | ICD-10-CM

## 2013-07-03 ENCOUNTER — Other Ambulatory Visit: Payer: Self-pay | Admitting: General Practice

## 2013-07-03 MED ORDER — CYANOCOBALAMIN 1000 MCG/ML IJ SOLN: INTRAMUSCULAR | Status: DC

## 2013-07-05 ENCOUNTER — Ambulatory Visit
Admission: RE | Admit: 2013-07-05 | Discharge: 2013-07-05 | Disposition: A | Discharge status: Home / Self Care | Payer: 59 | Source: Ambulatory Visit | Attending: Family Medicine | Admitting: Family Medicine

## 2013-07-05 ENCOUNTER — Other Ambulatory Visit: Payer: Self-pay | Admitting: Family Medicine

## 2013-07-05 DIAGNOSIS — N644 Mastodynia: Secondary | ICD-10-CM

## 2013-07-05 DIAGNOSIS — N63 Unspecified lump in unspecified breast: Secondary | ICD-10-CM

## 2013-07-19 ENCOUNTER — Other Ambulatory Visit: Payer: Self-pay | Admitting: Family Medicine

## 2013-07-19 MED ORDER — CICLOPIROX 1 % EX SHAM: 1.0000 "application " | MEDICATED_SHAMPOO | Freq: Every day | CUTANEOUS | Status: DC

## 2013-08-07 ENCOUNTER — Telehealth: Payer: Self-pay

## 2013-08-07 MED ORDER — SULFAMETHOXAZOLE-TMP DS 800-160 MG PO TABS: 1.0000 | ORAL_TABLET | Freq: Two times a day (BID) | ORAL | Status: DC

## 2013-08-07 NOTE — Telephone Encounter (Signed)
C/o left finger nail pain. Client suspects fungal infection. Left ring finger nail swollen, slightly red, warm and painful to touch.  No discoloration noted.  Denies recent injury.  Please advise.

## 2013-08-07 NOTE — Telephone Encounter (Signed)
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  Will start Bactrim for infection.

## 2013-08-30 ENCOUNTER — Other Ambulatory Visit: Payer: Self-pay | Admitting: Family Medicine

## 2013-08-30 ENCOUNTER — Telehealth: Payer: Self-pay | Admitting: General Practice

## 2013-08-30 DIAGNOSIS — G629 Polyneuropathy, unspecified: Secondary | ICD-10-CM

## 2013-08-30 DIAGNOSIS — E559 Vitamin D deficiency, unspecified: Secondary | ICD-10-CM

## 2013-08-30 NOTE — Telephone Encounter (Signed)
Pt notified and labs ordered.

## 2013-08-30 NOTE — Telephone Encounter (Signed)
Please advise, pt came in complaining of neuropathy in her extremities. Would like to know if an A1c can be drawn since b12 injections are not helping.

## 2013-08-30 NOTE — Telephone Encounter (Signed)
Please order the following to assess the neuropathy: CBC w/ diff TSH BMP A1C B12/folate

## 2013-08-31 ENCOUNTER — Other Ambulatory Visit (INDEPENDENT_AMBULATORY_CARE_PROVIDER_SITE_OTHER): Payer: 59

## 2013-08-31 DIAGNOSIS — G589 Mononeuropathy, unspecified: Secondary | ICD-10-CM

## 2013-08-31 DIAGNOSIS — E559 Vitamin D deficiency, unspecified: Secondary | ICD-10-CM

## 2013-08-31 DIAGNOSIS — G629 Polyneuropathy, unspecified: Secondary | ICD-10-CM

## 2013-08-31 LAB — CBC WITH DIFFERENTIAL/PLATELET
BASOS ABS: 0 10*3/uL (ref 0.0–0.1)
Basophils Relative: 0.4 % (ref 0.0–3.0)
Eosinophils Absolute: 0.1 10*3/uL (ref 0.0–0.7)
Eosinophils Relative: 3.7 % (ref 0.0–5.0)
HEMATOCRIT: 37.7 % (ref 36.0–46.0)
HEMOGLOBIN: 12.6 g/dL (ref 12.0–15.0)
LYMPHS ABS: 1.4 10*3/uL (ref 0.7–4.0)
LYMPHS PCT: 46.9 % — AB (ref 12.0–46.0)
MCHC: 33.4 g/dL (ref 30.0–36.0)
MCV: 89.2 fl (ref 78.0–100.0)
MONO ABS: 0.2 10*3/uL (ref 0.1–1.0)
Monocytes Relative: 6.2 % (ref 3.0–12.0)
NEUTROS ABS: 1.3 10*3/uL — AB (ref 1.4–7.7)
Neutrophils Relative %: 42.8 % — ABNORMAL LOW (ref 43.0–77.0)
Platelets: 226 10*3/uL (ref 150.0–400.0)
RBC: 4.23 Mil/uL (ref 3.87–5.11)
RDW: 13.4 % (ref 11.5–14.6)
WBC: 3.1 10*3/uL — ABNORMAL LOW (ref 4.5–10.5)

## 2013-08-31 LAB — VITAMIN B12: Vitamin B-12: 392 pg/mL (ref 211–911)

## 2013-08-31 LAB — BASIC METABOLIC PANEL
BUN: 13 mg/dL (ref 6–23)
CALCIUM: 9.5 mg/dL (ref 8.4–10.5)
CO2: 30 mEq/L (ref 19–32)
CREATININE: 0.8 mg/dL (ref 0.4–1.2)
Chloride: 102 mEq/L (ref 96–112)
GFR: 113.4 mL/min (ref >60.00)
Glucose, Bld: 95 mg/dL (ref 70–99)
Potassium: 4.2 mEq/L (ref 3.5–5.1)
Sodium: 138 mEq/L (ref 135–145)

## 2013-08-31 LAB — FOLATE: Folate: >24.8 ng/mL (ref >5.9)

## 2013-08-31 LAB — TSH: TSH: 1.46 u[IU]/mL (ref 0.35–5.50)

## 2013-08-31 LAB — HEMOGLOBIN A1C: HEMOGLOBIN A1C: 5.5 % (ref 4.6–6.5)

## 2013-09-04 LAB — VITAMIN D 1,25 DIHYDROXY
Vitamin D 1, 25 (OH)2 Total: 34 pg/mL (ref 18–72)
Vitamin D2 1, 25 (OH)2: <8 pg/mL
Vitamin D3 1, 25 (OH)2: 34 pg/mL

## 2013-09-11 ENCOUNTER — Ambulatory Visit (HOSPITAL_BASED_OUTPATIENT_CLINIC_OR_DEPARTMENT_OTHER)
Admission: RE | Admit: 2013-09-11 | Discharge: 2013-09-11 | Disposition: A | Discharge status: Home / Self Care | Payer: 59 | Source: Ambulatory Visit | Attending: Family Medicine | Admitting: Family Medicine

## 2013-09-11 ENCOUNTER — Ambulatory Visit (INDEPENDENT_AMBULATORY_CARE_PROVIDER_SITE_OTHER): Payer: 59 | Admitting: Family Medicine

## 2013-09-11 ENCOUNTER — Encounter: Payer: Self-pay | Admitting: General Practice

## 2013-09-11 ENCOUNTER — Encounter: Payer: Self-pay | Admitting: Family Medicine

## 2013-09-11 VITALS — BP 100/80 | HR 87 | Temp 98.5°F | Resp 16 | Ht 63.0 in | Wt 132.0 lb

## 2013-09-11 DIAGNOSIS — M79662 Pain in left lower leg: Secondary | ICD-10-CM

## 2013-09-11 DIAGNOSIS — M79609 Pain in unspecified limb: Secondary | ICD-10-CM

## 2013-09-11 NOTE — Assessment & Plan Note (Signed)
New.  Pt has family hx of DVT but no obvious personal risk factors- smoking/OCPs.  Get venous doppler to assess.  If negative, treat as muscular strain w/ scheduled NSAIDs, ice.  Reviewed supportive care and red flags that should prompt return.  Pt expressed understanding and is in agreement w/ plan.

## 2013-09-11 NOTE — Progress Notes (Signed)
   Subjective:    Patient ID: Anna [name], female    DOB: [birth year], 33 y.o.   MRN: [id number]  HPI Calf pain- L leg, pain started months ago.  Tends to wear support hose and 'try to ignore it'.  Yesterday palpated L calf and has had throbbing pain ever since.  Not swollen.  Not red.  + family hx DVT.  Not on OCPs, not a smoker.  No CP, SOB, edema of hands or feet.   Review of Systems For ROS see HPI     Objective:   Physical Exam  Vitals reviewed. Constitutional: She is oriented to person, place, and time. She appears well-developed and well-nourished. No distress.  Cardiovascular: Normal rate, regular rhythm, normal heart sounds and intact distal pulses.   Pulmonary/Chest: Effort normal and breath sounds normal. No respiratory distress. She has no wheezes. She has no rales.  Musculoskeletal: She exhibits tenderness (over L posterior/lateral calf w/ palpable knot but w/o obvious cord ). She exhibits no edema.  Neurological: She is alert and oriented to person, place, and time. She has normal reflexes. No cranial nerve deficit. Coordination normal.  Skin: Skin is warm and dry. No erythema.          Assessment & Plan:

## 2013-09-11 NOTE — Progress Notes (Signed)
Pre visit review using our clinic review tool, if applicable. No additional management support is needed unless otherwise documented below in the visit note. 

## 2013-09-11 NOTE — Patient Instructions (Signed)
We'll notify you of your doppler results (11:30 appt) If negative, we'll treat as a muscular strain- ice, scheduled NSAIDs Try some gentle stretching to avoid stiffness Call with any questions or concerns Hang in there!

## 2013-12-05 ENCOUNTER — Other Ambulatory Visit: Payer: Self-pay | Admitting: Family Medicine

## 2013-12-05 DIAGNOSIS — N63 Unspecified lump in unspecified breast: Secondary | ICD-10-CM

## 2013-12-06 ENCOUNTER — Telehealth: Payer: Self-pay | Admitting: General Practice

## 2013-12-06 ENCOUNTER — Other Ambulatory Visit (INDEPENDENT_AMBULATORY_CARE_PROVIDER_SITE_OTHER): Payer: 59

## 2013-12-06 DIAGNOSIS — G629 Polyneuropathy, unspecified: Secondary | ICD-10-CM

## 2013-12-06 DIAGNOSIS — G589 Mononeuropathy, unspecified: Secondary | ICD-10-CM

## 2013-12-06 LAB — HEMOGLOBIN A1C: Hgb A1c MFr Bld: 5.8 % (ref 4.6–6.5)

## 2013-12-06 NOTE — Telephone Encounter (Signed)
orders placed

## 2013-12-06 NOTE — Telephone Encounter (Signed)
Pt called wanting to have her a1c rechecked. Pt was scheduled for this afternoon. [place] for orders. Last A1c was drawn on 08/31/13.

## 2013-12-06 NOTE — Telephone Encounter (Signed)
Ok for A1C

## 2013-12-15 ENCOUNTER — Other Ambulatory Visit: Payer: Self-pay | Admitting: General Practice

## 2014-01-01 ENCOUNTER — Other Ambulatory Visit: Payer: Self-pay

## 2014-01-01 ENCOUNTER — Other Ambulatory Visit: Payer: Self-pay | Admitting: Family Medicine

## 2014-01-01 DIAGNOSIS — N63 Unspecified lump in unspecified breast: Secondary | ICD-10-CM

## 2014-01-03 ENCOUNTER — Encounter (INDEPENDENT_AMBULATORY_CARE_PROVIDER_SITE_OTHER): Payer: Self-pay

## 2014-01-03 ENCOUNTER — Ambulatory Visit
Admission: RE | Admit: 2014-01-03 | Discharge: 2014-01-03 | Disposition: A | Discharge status: Home / Self Care | Payer: 59 | Source: Ambulatory Visit | Attending: Family Medicine | Admitting: Family Medicine

## 2014-01-03 DIAGNOSIS — N63 Unspecified lump in unspecified breast: Secondary | ICD-10-CM

## 2014-01-19 ENCOUNTER — Other Ambulatory Visit: Payer: Self-pay | Admitting: General Practice

## 2014-01-19 MED ORDER — CICLOPIROX 1 % EX SHAM
1.0000 | MEDICATED_SHAMPOO | Freq: Every day | CUTANEOUS | Status: DC
Start: 2014-01-19 — End: 2014-09-24

## 2014-03-05 ENCOUNTER — Encounter: Payer: Self-pay | Admitting: Family Medicine

## 2014-03-08 ENCOUNTER — Other Ambulatory Visit: Payer: Self-pay | Admitting: *Deleted

## 2014-03-08 MED ORDER — ESOMEPRAZOLE MAGNESIUM 40 MG PO CPDR: 40.0000 mg | DELAYED_RELEASE_CAPSULE | Freq: Every day | ORAL | Status: DC

## 2014-03-16 ENCOUNTER — Other Ambulatory Visit (INDEPENDENT_AMBULATORY_CARE_PROVIDER_SITE_OTHER): Payer: 59

## 2014-03-16 DIAGNOSIS — G629 Polyneuropathy, unspecified: Secondary | ICD-10-CM

## 2014-03-16 DIAGNOSIS — Z0000 Encounter for general adult medical examination without abnormal findings: Secondary | ICD-10-CM

## 2014-03-16 LAB — HEPATIC FUNCTION PANEL
ALBUMIN: 3.8 g/dL (ref 3.5–5.2)
ALT: 9 U/L (ref 0–35)
AST: 18 U/L (ref 0–37)
Alkaline Phosphatase: 35 U/L — ABNORMAL LOW (ref 39–117)
Bilirubin, Direct: 0 mg/dL (ref 0.0–0.3)
TOTAL PROTEIN: 7.2 g/dL (ref 6.0–8.3)
Total Bilirubin: 0.5 mg/dL (ref 0.2–1.2)

## 2014-03-16 LAB — BASIC METABOLIC PANEL
BUN: 13 mg/dL (ref 6–23)
CALCIUM: 9.6 mg/dL (ref 8.4–10.5)
CO2: 27 meq/L (ref 19–32)
Chloride: 104 mEq/L (ref 96–112)
Creatinine, Ser: 0.7 mg/dL (ref 0.4–1.2)
GFR: 118.49 mL/min (ref >60.00)
GLUCOSE: 87 mg/dL (ref 70–99)
Potassium: 3.8 mEq/L (ref 3.5–5.1)
Sodium: 140 mEq/L (ref 135–145)

## 2014-03-16 LAB — CBC WITH DIFFERENTIAL/PLATELET
Basophils Absolute: 0 10*3/uL (ref 0.0–0.1)
Basophils Relative: 0.3 % (ref 0.0–3.0)
Eosinophils Absolute: 0.1 10*3/uL (ref 0.0–0.7)
Eosinophils Relative: 2.9 % (ref 0.0–5.0)
HCT: 38.3 % (ref 36.0–46.0)
HEMOGLOBIN: 12.6 g/dL (ref 12.0–15.0)
LYMPHS PCT: 37.8 % (ref 12.0–46.0)
Lymphs Abs: 1.5 10*3/uL (ref 0.7–4.0)
MCHC: 33 g/dL (ref 30.0–36.0)
MCV: 88.8 fl (ref 78.0–100.0)
MONOS PCT: 6.1 % (ref 3.0–12.0)
Monocytes Absolute: 0.2 10*3/uL (ref 0.1–1.0)
NEUTROS ABS: 2.1 10*3/uL (ref 1.4–7.7)
Neutrophils Relative %: 52.9 % (ref 43.0–77.0)
Platelets: 228 10*3/uL (ref 150.0–400.0)
RBC: 4.31 Mil/uL (ref 3.87–5.11)
RDW: 13.7 % (ref 11.5–15.5)
WBC: 3.9 10*3/uL — ABNORMAL LOW (ref 4.0–10.5)

## 2014-03-16 LAB — LIPID PANEL
CHOLESTEROL: 144 mg/dL (ref 0–200)
HDL: 46.4 mg/dL (ref >39.00)
LDL Cholesterol: 90 mg/dL (ref 0–99)
NonHDL: 97.6
TRIGLYCERIDES: 38 mg/dL (ref 0.0–149.0)
Total CHOL/HDL Ratio: 3
VLDL: 7.6 mg/dL (ref 0.0–40.0)

## 2014-03-16 LAB — HEMOGLOBIN A1C: HEMOGLOBIN A1C: 5.7 % (ref 4.6–6.5)

## 2014-03-19 NOTE — Progress Notes (Signed)
Was not reviewed from patient(CMA's) in basket. Dropped down to this results while working the basket.

## 2014-03-20 ENCOUNTER — Telehealth: Payer: Self-pay | Admitting: Family Medicine

## 2014-03-20 MED ORDER — METRONIDAZOLE 0.75 % VA GEL: 1.0000 | Freq: Two times a day (BID) | VAGINAL | Status: DC

## 2014-03-20 NOTE — Telephone Encounter (Signed)
Pt notes that she is having reaction to scented panty liners that she bought by mistake.  Now w/ scant vaginal d/c and odor.  Hx of BV.  Asking for metrogel script.  Script sent.

## 2014-04-24 ENCOUNTER — Other Ambulatory Visit: Payer: Self-pay | Admitting: General Practice

## 2014-04-24 MED ORDER — VALACYCLOVIR HCL 1 G PO TABS: 1000.0000 mg | ORAL_TABLET | Freq: Every day | ORAL | Status: DC

## 2014-06-12 ENCOUNTER — Other Ambulatory Visit: Payer: Self-pay | Admitting: Family Medicine

## 2014-06-12 DIAGNOSIS — L02419 Cutaneous abscess of limb, unspecified: Secondary | ICD-10-CM

## 2014-06-12 DIAGNOSIS — L03119 Cellulitis of unspecified part of limb: Principal | ICD-10-CM

## 2014-06-12 MED ORDER — DOXYCYCLINE HYCLATE 100 MG PO TABS: 100.0000 mg | ORAL_TABLET | Freq: Two times a day (BID) | ORAL | Status: DC

## 2014-06-20 ENCOUNTER — Other Ambulatory Visit: Payer: Self-pay | Admitting: Family Medicine

## 2014-06-20 DIAGNOSIS — D241 Benign neoplasm of right breast: Secondary | ICD-10-CM

## 2014-07-04 ENCOUNTER — Encounter (INDEPENDENT_AMBULATORY_CARE_PROVIDER_SITE_OTHER): Payer: Self-pay

## 2014-07-04 ENCOUNTER — Ambulatory Visit
Admission: RE | Admit: 2014-07-04 | Discharge: 2014-07-04 | Disposition: A | Discharge status: Home / Self Care | Payer: 59 | Source: Ambulatory Visit | Attending: Family Medicine | Admitting: Family Medicine

## 2014-07-04 DIAGNOSIS — D241 Benign neoplasm of right breast: Secondary | ICD-10-CM

## 2014-07-10 ENCOUNTER — Other Ambulatory Visit: Payer: Self-pay | Admitting: Family Medicine

## 2014-07-10 MED ORDER — ALIGN 4 MG PO CAPS: ORAL_CAPSULE | ORAL | Status: DC

## 2014-08-02 ENCOUNTER — Other Ambulatory Visit: Payer: Self-pay | Admitting: *Deleted

## 2014-08-02 MED ORDER — CYANOCOBALAMIN 1000 MCG/ML IJ SOLN: INTRAMUSCULAR | Status: DC

## 2014-08-02 MED ORDER — LORATADINE 10 MG PO TABS: 10.0000 mg | ORAL_TABLET | Freq: Every day | ORAL | Status: DC

## 2014-08-02 NOTE — Telephone Encounter (Signed)
Rx's sent to the pharmacy by e-script.//AB/CMA 

## 2014-09-24 ENCOUNTER — Encounter: Payer: Self-pay | Admitting: Physician Assistant

## 2014-09-24 ENCOUNTER — Ambulatory Visit (INDEPENDENT_AMBULATORY_CARE_PROVIDER_SITE_OTHER): Payer: 59 | Admitting: Physician Assistant

## 2014-09-24 VITALS — BP 98/72 | HR 79 | Temp 98.2°F | Ht 63.0 in | Wt 135.0 lb

## 2014-09-24 DIAGNOSIS — J329 Chronic sinusitis, unspecified: Secondary | ICD-10-CM | POA: Diagnosis not present

## 2014-09-24 DIAGNOSIS — B349 Viral infection, unspecified: Secondary | ICD-10-CM | POA: Diagnosis not present

## 2014-09-24 DIAGNOSIS — B9789 Other viral agents as the cause of diseases classified elsewhere: Secondary | ICD-10-CM | POA: Insufficient documentation

## 2014-09-24 MED ORDER — PSEUDOEPHEDRINE-GUAIFENESIN ER 120-1200 MG PO TB12: 1.0000 | ORAL_TABLET | Freq: Two times a day (BID) | ORAL | Status: DC

## 2014-09-24 MED ORDER — METHYLPREDNISOLONE ACETATE 40 MG/ML IJ SUSP
40.0000 mg | Freq: Once | INTRAMUSCULAR | Status: AC
Start: 2014-09-24 — End: 2014-09-24
  Administered 2014-09-24: 40 mg via INTRAMUSCULAR

## 2014-09-24 NOTE — Assessment & Plan Note (Signed)
Resolving but with new symptoms of ear pressure/pain.  Exam negative for AOM but there is fluid collection behind R TM.  40 mg IM Depomedrol given.  Resume Claritin and Q-Nasl.  Rx Mucinex-D BID PRN.  Saline nasal spray and increased hydration.  Follow-up PRN.

## 2014-09-24 NOTE — Patient Instructions (Signed)
Please stay well hydrated and use saline nasal spray to keep sinuses rinsed.  Take Mucinex-D as directed.  I have sent a prescription to your pharmacy.  Continue your allergy medications.  The steroid shot given will help to open your Eustachian tubes and relieve pressure/fluid.  The decongestant in the Mucinex will also help with this.  Follow-up if symptoms are not continuing to resolve.

## 2014-09-24 NOTE — Progress Notes (Signed)
Patient presents to clinic today c/o 4 days ofnasal congestion, sinus pressure, sinus pain, PND and cough. Endorses these symptoms have improved dramatically over past couple of days. Endorses new onset of R ear pain and pressure x 1 day. Endorses muffles hearing.  Denies drainage from ear. Denies fever, chills, SOB or wheezing.  Past Medical History  Diagnosis Date  . GERD (gastroesophageal reflux disease)   . IBS (irritable bowel syndrome)   . PVC (premature ventricular contraction)   . Migraine   . Allergy   . Anemia   . Blood transfusion without reported diagnosis     at birth    Current Outpatient Prescriptions on File Prior to Visit  Medication Sig Dispense Refill  . cyanocobalamin (,VITAMIN B-12,) 1000 MCG/ML injection Inject 1 ml or 1000 mcg of Vit B12 into muscle monthly for 1 year 12 mL 1  . lubiprostone (AMITIZA) 8 MCG capsule Take 8 mcg by mouth 2 (two) times daily with a meal.    . valACYclovir (VALTREX) 1000 MG tablet Take 1 tablet (1,000 mg total) by mouth daily. 90 tablet 1   No current facility-administered medications on file prior to visit.    Allergies  Allergen Reactions  . Aciphex [Rabeprazole Sodium]   . Latex     Family History  Problem Relation Age of Onset  . Arthritis Maternal Grandmother   . Colon cancer Maternal Grandmother   . Hypertension Maternal Grandmother   . Diabetes Maternal Grandmother   . Arthritis Maternal Uncle   . Heart disease Maternal Uncle   . Ovarian cancer Maternal Aunt   . Prostate cancer Maternal Uncle   . Stroke Maternal Grandfather   . Kidney disease Maternal Grandfather   . Heart disease Maternal Grandfather   . Kidney failure Maternal Grandfather   . Diabetes Maternal Grandfather   . Diabetes Paternal Grandmother   . Bone cancer Paternal Grandmother   . Breast cancer Paternal Grandmother   . Prostate cancer Maternal Uncle   . Colon cancer Maternal Uncle   . Esophageal cancer Neg Hx   . Rectal cancer Neg Hx   .  Stomach cancer Neg Hx     History   Social History  . Marital Status: Divorced    Spouse Name: N/A  . Number of Children: 2  . Years of Education: N/A   Social History Main Topics  . Smoking status: Never Smoker   . Smokeless tobacco: Never Used  . Alcohol Use: No  . Drug Use: No  . Sexual Activity: Yes    Birth Control/ Protection: Condom   Other Topics Concern  . None   Social History Narrative   Review of Systems - See HPI.  All other ROS are negative.  BP 98/72 mmHg  Pulse 79  Temp(Src) 98.2 F (36.8 C) (Oral)  Ht 5\' 3"  (1.6 m)  Wt 135 lb (61.236 kg)  BMI 23.92 kg/m2  SpO2 98%  Physical Exam  Constitutional: She is oriented to person, place, and time and well-developed, well-nourished, and in no distress.  HENT:  Head: Normocephalic and atraumatic.  Right Ear: External ear normal.  Left Ear: External ear normal.  Nose: Nose normal.  Mouth/Throat: Oropharynx is clear and moist. No oropharyngeal exudate.  Eyes: Conjunctivae are normal. Pupils are equal, round, and reactive to light.  Neck: Neck supple.  Cardiovascular: Normal rate, regular rhythm, normal heart sounds and intact distal pulses.   Pulmonary/Chest: Effort normal and breath sounds normal. No respiratory distress. She has no  wheezes. She has no rales. She exhibits no tenderness.  Lymphadenopathy:    She has no cervical adenopathy.  Neurological: She is alert and oriented to person, place, and time.  Skin: Skin is warm and dry. No rash noted.  Psychiatric: Affect normal.  Vitals reviewed.    Assessment/Plan: Viral sinusitis Resolving but with new symptoms of ear pressure/pain.  Exam negative for AOM but there is fluid collection behind R TM.  40 mg IM Depomedrol given.  Resume Claritin and Q-Nasl.  Rx Mucinex-D BID PRN.  Saline nasal spray and increased hydration.  Follow-up PRN.

## 2014-09-24 NOTE — Addendum Note (Signed)
Addended by: Harl Bowie on: 09/24/2014 09:19 AM   Modules accepted: Orders

## 2014-11-21 ENCOUNTER — Encounter: Payer: Self-pay | Admitting: Physician Assistant

## 2014-11-21 ENCOUNTER — Telehealth: Payer: Self-pay | Admitting: Physician Assistant

## 2014-11-21 ENCOUNTER — Ambulatory Visit (INDEPENDENT_AMBULATORY_CARE_PROVIDER_SITE_OTHER): Payer: 59 | Admitting: Physician Assistant

## 2014-11-21 VITALS — BP 95/68 | HR 70 | Temp 98.2°F | Ht 63.0 in | Wt 134.0 lb

## 2014-11-21 DIAGNOSIS — R1011 Right upper quadrant pain: Secondary | ICD-10-CM

## 2014-11-21 LAB — COMPREHENSIVE METABOLIC PANEL
ALBUMIN: 4.3 g/dL (ref 3.5–5.2)
ALK PHOS: 39 U/L (ref 39–117)
ALT: 6 U/L (ref 0–35)
AST: 15 U/L (ref 0–37)
BUN: 9 mg/dL (ref 6–23)
CHLORIDE: 102 meq/L (ref 96–112)
CO2: 35 meq/L — AB (ref 19–32)
Calcium: 9.4 mg/dL (ref 8.4–10.5)
Creatinine, Ser: 0.75 mg/dL (ref 0.40–1.20)
GFR: 112.6 mL/min (ref >60.00)
Glucose, Bld: 74 mg/dL (ref 70–99)
POTASSIUM: 3.6 meq/L (ref 3.5–5.1)
SODIUM: 139 meq/L (ref 135–145)
TOTAL PROTEIN: 6.9 g/dL (ref 6.0–8.3)
Total Bilirubin: 0.4 mg/dL (ref 0.2–1.2)

## 2014-11-21 LAB — URINALYSIS, ROUTINE W REFLEX MICROSCOPIC
BILIRUBIN URINE: NEGATIVE
Hgb urine dipstick: NEGATIVE
KETONES UR: NEGATIVE
Leukocytes, UA: NEGATIVE
Nitrite: NEGATIVE
PH: 7 (ref 5.0–8.0)
Specific Gravity, Urine: 1.02 (ref 1.000–1.030)
Total Protein, Urine: NEGATIVE
UROBILINOGEN UA: 0.2 (ref 0.0–1.0)
Urine Glucose: NEGATIVE

## 2014-11-21 LAB — CBC
HCT: 37.5 % (ref 36.0–46.0)
HEMOGLOBIN: 12.5 g/dL (ref 12.0–15.0)
MCHC: 33.3 g/dL (ref 30.0–36.0)
MCV: 88.8 fl (ref 78.0–100.0)
Platelets: 208 10*3/uL (ref 150.0–400.0)
RBC: 4.22 Mil/uL (ref 3.87–5.11)
RDW: 14.2 % (ref 11.5–15.5)
WBC: 3.1 10*3/uL — ABNORMAL LOW (ref 4.0–10.5)

## 2014-11-21 LAB — H. PYLORI ANTIBODY, IGG: H PYLORI IGG: NEGATIVE

## 2014-11-21 LAB — LIPASE: Lipase: 25 U/L (ref 11.0–59.0)

## 2014-11-21 NOTE — Telephone Encounter (Signed)
Discussed results with patient. Order for US abdomen placed to further assess.

## 2014-11-21 NOTE — Progress Notes (Signed)
Patient presents to clinic today c/o RUQ pain radiating to back x 1 month that is worse with eating despite eating light meals. Denies fever, chills. Denies urinary symptoms. Endorses intermittent nausea without emesis. Denies heart burn or indigestion. Denies cough. Endorses increase constipation despite Amitiza for IBS. Denies hx of SBO. No abdominal surgeries. Denies blood in stool. Is passing gas and stool with last bowel movement within 48 hours which is chronic for her. Endorses her sister had gallbladder issues -- cholecystectomy.  Past Medical History  Diagnosis Date  . GERD (gastroesophageal reflux disease)   . IBS (irritable bowel syndrome)   . PVC (premature ventricular contraction)   . Migraine   . Allergy   . Anemia   . Blood transfusion without reported diagnosis     at birth    Current Outpatient Prescriptions on File Prior to Visit  Medication Sig Dispense Refill  . cyanocobalamin (,VITAMIN B-12,) 1000 MCG/ML injection Inject 1 ml or 1000 mcg of Vit B12 into muscle monthly for 1 year 12 mL 1  . lubiprostone (AMITIZA) 8 MCG capsule Take 8 mcg by mouth 2 (two) times daily with a meal.    . Pseudoephedrine-Guaifenesin [phone] MG TB12 Take 1 tablet by mouth every 12 (twelve) hours. 60 each 0  . valACYclovir (VALTREX) 1000 MG tablet Take 1 tablet (1,000 mg total) by mouth daily. 90 tablet 1   No current facility-administered medications on file prior to visit.    Allergies  Allergen Reactions  . Aciphex [Rabeprazole Sodium]   . Latex     Family History  Problem Relation Age of Onset  . Arthritis Maternal Grandmother   . Colon cancer Maternal Grandmother   . Hypertension Maternal Grandmother   . Diabetes Maternal Grandmother   . Arthritis Maternal Uncle   . Heart disease Maternal Uncle   . Ovarian cancer Maternal Aunt   . Prostate cancer Maternal Uncle   . Stroke Maternal Grandfather   . Kidney disease Maternal Grandfather   . Heart disease Maternal  Grandfather   . Kidney failure Maternal Grandfather   . Diabetes Maternal Grandfather   . Diabetes Paternal Grandmother   . Bone cancer Paternal Grandmother   . Breast cancer Paternal Grandmother   . Prostate cancer Maternal Uncle   . Colon cancer Maternal Uncle   . Esophageal cancer Neg Hx   . Rectal cancer Neg Hx   . Stomach cancer Neg Hx     History   Social History  . Marital Status: Divorced    Spouse Name: N/A  . Number of Children: 2  . Years of Education: N/A   Social History Main Topics  . Smoking status: Never Smoker   . Smokeless tobacco: Never Used  . Alcohol Use: No  . Drug Use: No  . Sexual Activity: Yes    Birth Control/ Protection: Condom   Other Topics Concern  . None   Social History Narrative   Review of Systems - See HPI.  All other ROS are negative.  BP 95/68 mmHg  Pulse 70  Temp(Src) 98.2 F (36.8 C) (Oral)  Ht 5\' 3"  (1.6 m)  Wt 134 lb (60.782 kg)  BMI 23.74 kg/m2  SpO2 99%  Physical Exam  Constitutional: She is oriented to person, place, and time and well-developed, well-nourished, and in no distress.  HENT:  Head: Normocephalic and atraumatic.  Eyes: Conjunctivae are normal.  Neck: Neck supple.  Cardiovascular: Normal rate, regular rhythm, normal heart sounds and intact distal pulses.  Pulmonary/Chest: Effort normal and breath sounds normal. No respiratory distress. She has no wheezes. She has no rales. She exhibits no tenderness.  Abdominal: Bowel sounds are normal. There is no hepatosplenomegaly. There is tenderness in the right upper quadrant and epigastric area. There is no rigidity, no guarding, no CVA tenderness and negative Murphy's sign.  Neurological: She is alert and oriented to person, place, and time.  Skin: Skin is warm and dry. No rash noted.  Psychiatric: Affect normal.  Vitals reviewed.  Assessment/Plan: Colicky RUQ abdominal pain Concern for gallbladder or gastric etiology. No overt risk factors for ulceration.  Will check CBC, CMP, Lipase, H. Pylori, and UA. Will also obtain US to further assess. Instructions regarding bowel regimen, Amitiza and meals discussed. Follow-up will be based on lab results and [place]. Will likely need GI consult.

## 2014-11-21 NOTE — Progress Notes (Signed)
Pre visit review using our clinic review tool, if applicable. No additional management support is needed unless otherwise documented below in the visit note. 

## 2014-11-21 NOTE — Patient Instructions (Signed)
Please go to the lab for blood work. I will call you with your results. We will proceed with [place] unless results warrant a CT scan.  Please increase Amitiza to 3 tablets (24) over the next day to help promote bowel movement. If no bowel movement within 24 hours, take 2 Tbs of Milk of Magnesia in a 4 oz glass of warmed prune juice every 2-3 days to help promote bowel movement. If no results within 24 hours, then repeat above regimen, adding a Dulcolax stool softener to regimen. If this does not promote a bowel movement, please call the office.

## 2014-11-23 ENCOUNTER — Other Ambulatory Visit: Payer: Self-pay | Admitting: Physician Assistant

## 2014-11-23 ENCOUNTER — Ambulatory Visit (HOSPITAL_BASED_OUTPATIENT_CLINIC_OR_DEPARTMENT_OTHER)
Admission: RE | Admit: 2014-11-23 | Discharge: 2014-11-23 | Disposition: A | Discharge status: Home / Self Care | Payer: 59 | Source: Ambulatory Visit | Attending: Physician Assistant | Admitting: Physician Assistant

## 2014-11-23 DIAGNOSIS — G8929 Other chronic pain: Secondary | ICD-10-CM

## 2014-11-23 DIAGNOSIS — R1011 Right upper quadrant pain: Secondary | ICD-10-CM | POA: Diagnosis not present

## 2014-11-23 DIAGNOSIS — R110 Nausea: Secondary | ICD-10-CM | POA: Insufficient documentation

## 2014-11-23 MED ORDER — RANITIDINE HCL 150 MG PO TABS: 150.0000 mg | ORAL_TABLET | Freq: Two times a day (BID) | ORAL | Status: DC

## 2014-11-23 NOTE — Assessment & Plan Note (Signed)
Concern for gallbladder or gastric etiology. No overt risk factors for ulceration. Will check CBC, CMP, Lipase, H. Pylori, and UA. Will also obtain US to further assess. Instructions regarding bowel regimen, Amitiza and meals discussed. Follow-up will be based on lab results and [place]. Will likely need GI consult.

## 2014-11-28 LAB — HM PAP SMEAR

## 2014-12-26 ENCOUNTER — Encounter: Payer: Self-pay | Admitting: Gastroenterology

## 2014-12-26 ENCOUNTER — Ambulatory Visit (INDEPENDENT_AMBULATORY_CARE_PROVIDER_SITE_OTHER): Payer: 59 | Admitting: Gastroenterology

## 2014-12-26 VITALS — BP 98/60 | HR 68 | Ht 63.0 in | Wt 135.4 lb

## 2014-12-26 DIAGNOSIS — R1011 Right upper quadrant pain: Secondary | ICD-10-CM

## 2014-12-26 MED ORDER — HYOSCYAMINE SULFATE ER 0.375 MG PO TBCR: EXTENDED_RELEASE_TABLET | ORAL | Status: DC

## 2014-12-26 NOTE — Assessment & Plan Note (Signed)
Six-week history of fairly persistent right upper quadrant pain.  Symptoms are very suggestive of chronic cholecystitis.  She sounds demonstrates a nonmobile gallstone versus a small polyp.  Recommendations #1 trial of hyomax 0.75 mg twice a day #2 HIDA scan

## 2014-12-26 NOTE — Progress Notes (Signed)
      History of Present Illness:  [name] is a pleasant 34 year old Afro-American female referred at the request of Dr. Etter Sjogren for evaluation of abdominal pain.  For the past 6 weeks she's been complaining of right upper quadrant pain.  Pain initially was intermittent and sharp with and without radiation to the back.  It was worsened postprandially and was drinking coffee and water.  Over the past couple weeks pain has become rather constant.  She has awakened with pain.  Lab work from July was normal.  Abdominal ultrasound, which I reviewed, demonstrated a probable small gallbladder polyp versus a nonmobile gallstone.  She takes ranitidine daily.  In 2014 she underwent an upper endoscopy and colonoscopy for symptoms of IBS.  Exams were negative.   Review of Systems: Pertinent positive and negative review of systems were noted in the above HPI section. All other review of systems were otherwise negative.    Current Medications, Allergies, Past Medical History, Past Surgical History, Family History and Social History were reviewed in [place] record  Vital signs were reviewed in today's medical record. Physical Exam: General: Well developed , well nourished, no acute distress Skin: anicteric Head: Normocephalic and atraumatic Eyes:  sclerae anicteric, EOMI Ears: Normal auditory acuity Mouth: No deformity or lesions Lymph Nodes: no lymphadenopathy Lungs: Clear throughout to auscultation Heart: Regular rate and rhythm; no murmurs, rubs or brui: Gastroinestinal:  Soft, non tender and non distended. No masses, hepatosplenomegaly or hernias noted. Normal Bowel sounds Rectal:deferred Musculoskeletal: Symmetrical with no gross deformities  Pulses:  Normal pulses noted Extremities: No clubbing, cyanosis, edema or deformities noted Neurological: Alert oriented x 4, grossly nonfocal Psychological:  Alert and cooperative. Normal mood and affect  See Assessment and Plan  under Problem List

## 2014-12-26 NOTE — Patient Instructions (Signed)
You have been scheduled for a HIDA scan at [facility](1st floor) on 01/02/2015 . Please arrive 15 minutes prior to your scheduled appointment at  7am  Make certain not to have anything to eat or drink at least 6 hours prior to your test. Should this appointment date or time not work well for you, please call radiology scheduling at [phone].  _____________________________________________________________________ hepatobiliary (HIDA) scan is an imaging procedure used to diagnose problems in the liver, gallbladder and bile ducts. In the HIDA scan, a radioactive chemical or tracer is injected into a vein in your arm. The tracer is handled by the liver like bile. Bile is a fluid produced and excreted by your liver that helps your digestive system break down fats in the foods you eat. Bile is stored in your gallbladder and the gallbladder releases the bile when you eat a meal. A special nuclear medicine scanner (gamma camera) tracks the flow of the tracer from your liver into your gallbladder and small intestine.  During your HIDA scan  You'll be asked to change into a hospital gown before your HIDA scan begins. Your health care team will position you on a table, usually on your back. The radioactive tracer is then injected into a vein in your arm.The tracer travels through your bloodstream to your liver, where it's taken up by the bile-producing cells. The radioactive tracer travels with the bile from your liver into your gallbladder and through your bile ducts to your small intestine.You may feel some pressure while the radioactive tracer is injected into your vein. As you lie on the table, a special gamma camera is positioned over your abdomen taking pictures of the tracer as it moves through your body. The gamma camera takes pictures continually for about an hour. You'll need to keep still during the HIDA scan. This can become uncomfortable, but you may find that you can lessen the discomfort by  taking deep breaths and thinking about other things. Tell your health care team if you're uncomfortable. The radiologist will watch on a computer the progress of the radioactive tracer through your body. The HIDA scan may be stopped when the radioactive tracer is seen in the gallbladder and enters your small intestine. This typically takes about an hour. In some cases extra imaging will be performed if original images aren't satisfactory, if morphine is given to help visualize the gallbladder or if the medication CCK is given to look at the contraction of the gallbladder. This test typically takes 2 hours to complete. ________________________________________________________________________

## 2015-01-02 ENCOUNTER — Ambulatory Visit (HOSPITAL_COMMUNITY)
Admission: RE | Admit: 2015-01-02 | Discharge: 2015-01-02 | Disposition: A | Discharge status: Home / Self Care | Payer: 59 | Source: Ambulatory Visit | Attending: Gastroenterology | Admitting: Gastroenterology

## 2015-01-02 DIAGNOSIS — R1011 Right upper quadrant pain: Secondary | ICD-10-CM | POA: Insufficient documentation

## 2015-01-02 DIAGNOSIS — R110 Nausea: Secondary | ICD-10-CM | POA: Diagnosis not present

## 2015-01-02 MED ORDER — SINCALIDE 5 MCG IJ SOLR
INTRAMUSCULAR | Status: AC
  Administered 2015-01-02: 1.2 ug via INTRAVENOUS
  Filled 2015-01-02: qty 5

## 2015-01-02 MED ORDER — TECHNETIUM TC 99M MEBROFENIN IV KIT
5.0000 | PACK | Freq: Once | INTRAVENOUS | Status: DC | PRN
  Administered 2015-01-02: 5 via INTRAVENOUS
  Filled 2015-01-02: qty 6

## 2015-01-02 MED ORDER — STERILE WATER FOR INJECTION IJ SOLN
INTRAMUSCULAR | Status: AC
  Filled 2015-01-02: qty 10

## 2015-01-02 MED ORDER — SINCALIDE 5 MCG IJ SOLR
0.0200 ug/kg | Freq: Once | INTRAMUSCULAR | Status: AC
  Administered 2015-01-02: 1.2 ug via INTRAVENOUS

## 2015-01-03 ENCOUNTER — Other Ambulatory Visit: Payer: Self-pay

## 2015-01-03 DIAGNOSIS — R1013 Epigastric pain: Secondary | ICD-10-CM

## 2015-01-09 ENCOUNTER — Encounter: Payer: Self-pay | Admitting: Gastroenterology

## 2015-01-09 ENCOUNTER — Ambulatory Visit (AMBULATORY_SURGERY_CENTER): Payer: 59 | Admitting: Gastroenterology

## 2015-01-09 VITALS — BP 106/66 | HR 76 | Temp 97.2°F | Resp 19 | Ht 63.0 in | Wt 135.0 lb

## 2015-01-09 DIAGNOSIS — R1011 Right upper quadrant pain: Secondary | ICD-10-CM

## 2015-01-09 MED ORDER — SODIUM CHLORIDE 0.9 % IV SOLN: 500.0000 mL | INTRAVENOUS | Status: DC

## 2015-01-09 MED ORDER — DICYCLOMINE HCL 10 MG PO CAPS: 10.0000 mg | ORAL_CAPSULE | Freq: Three times a day (TID) | ORAL | Status: DC

## 2015-01-09 NOTE — Patient Instructions (Signed)
YOU HAD AN ENDOSCOPIC PROCEDURE TODAY AT [facility]:   Refer to the procedure report that was given to you for any specific questions about what was found during the examination.  If the procedure report does not answer your questions, please call your gastroenterologist to clarify.  If you requested that your care partner not be given the details of your procedure findings, then the procedure report has been included in a sealed envelope for you to review at your convenience later.  YOU SHOULD EXPECT: Some feelings of bloating in the abdomen. Passage of more gas than usual.  Walking can help get rid of the air that was put into your GI tract during the procedure and reduce the bloating. If you had a lower endoscopy (such as a colonoscopy or flexible sigmoidoscopy) you may notice spotting of blood in your stool or on the toilet paper. If you underwent a bowel prep for your procedure, you may not have a normal bowel movement for a few days.  Please Note:  You might notice some irritation and congestion in your nose or some drainage.  This is from the oxygen used during your procedure.  There is no need for concern and it should clear up in a day or so.  SYMPTOMS TO REPORT IMMEDIATELY:     Following upper endoscopy (EGD)  Vomiting of blood or coffee ground material  New chest pain or pain under the shoulder blades  Painful or persistently difficult swallowing  New shortness of breath  Fever of 100F or higher  Black, tarry-looking stools  For urgent or emergent issues, a gastroenterologist can be reached at any hour by calling [phone].   DIET: Your first meal following the procedure should be a small meal and then it is ok to progress to your normal diet. Heavy or fried foods are harder to digest and may make you feel nauseous or bloated.  Likewise, meals heavy in dairy and vegetables can increase bloating.  Drink plenty of fluids but you should avoid alcoholic beverages  for 24 hours.  ACTIVITY:  You should plan to take it easy for the rest of today and you should NOT DRIVE or use heavy machinery until tomorrow (because of the sedation medicines used during the test).    FOLLOW UP: Our staff will call the number listed on your records the next business day following your procedure to check on you and address any questions or concerns that you may have regarding the information given to you following your procedure. If we do not reach you, we will leave a message.  However, if you are feeling well and you are not experiencing any problems, there is no need to return our call.  We will assume that you have returned to your regular daily activities without incident.  If any biopsies were taken you will be contacted by phone or by letter within the next 1-3 weeks.  Please call us at [phone] if you have not heard about the biopsies in 3 weeks.    SIGNATURES/CONFIDENTIALITY: You and/or your care partner have signed paperwork which will be entered into your electronic medical record.  These signatures attest to the fact that that the information above on your After Visit Summary has been reviewed and is understood.  Full responsibility of the confidentiality of this discharge information lies with you and/or your care-partner.  Stop ranitidine and hyomax and Resume remainder of medications. Call Dr. Deatra Ina in one week .

## 2015-01-09 NOTE — Progress Notes (Signed)
Transferred to recovery room. A/O x3, pleased with MAC.  VSS.  Report to Shelia, RN. 

## 2015-01-09 NOTE — Op Note (Signed)
[facility]  [address] [place], [address]   ENDOSCOPY PROCEDURE REPORT  PATIENT: Anna [name], Anna [name]  MR#: [id number] BIRTHDATE: 09/09/1980 , 33  yrs. old GENDER: female ENDOSCOPIST: Inda Castle, MD REFERRED BY:  Garnet Koyanagi, DO PROCEDURE DATE:  01/09/2015 PROCEDURE:  EGD, diagnostic ASA CLASS:     Class I INDICATIONS:  epigastric pain. MEDICATIONS: Monitored anesthesia care and Propofol 125 mg IV TOPICAL ANESTHETIC:  DESCRIPTION OF PROCEDURE: After the risks benefits and alternatives of the procedure were thoroughly explained, informed consent was obtained.  The LB [id number] endoscope was introduced through the mouth and advanced to the second portion of the duodenum , Without limitations.  The instrument was slowly withdrawn as the mucosa was fully examined.      EXAM: The esophagus and gastroesophageal junction were completely normal in appearance.  The stomach was entered and closely examined.The antrum, angularis, and lesser curvature were well visualized, including a retroflexed view of the cardia and fundus. The stomach wall was normally distensable.  The scope passed easily through the pylorus into the duodenum.  Retroflexed views revealed no abnormalities.     The scope was then withdrawn from the patient and the procedure completed.  COMPLICATIONS: There were no immediate complications.  ENDOSCOPIC IMPRESSION: Normal appearing esophagus and GE junction, the stomach was well visualized and normal in appearance, normal appearing duodenum  RECOMMENDATIONS: 1.  DC ranitidine and hyomax 2.  Trial of Bentyl 10 mg 4 times a day; call back in one week   REPEAT EXAM:  eSigned:  Inda Castle, MD 01/09/2015 2:43 PM    CC:

## 2015-01-10 ENCOUNTER — Telehealth: Payer: Self-pay | Admitting: *Deleted

## 2015-01-10 NOTE — Telephone Encounter (Signed)
  Follow up Call-  Call back number 01/09/2015 09/19/2012  Post procedure Call Back phone  # cell [phone] cell  Permission to leave phone message Yes Yes     Patient questions:  Do you have a fever, pain , or abdominal swelling? No. Pain Score  0 *  Have you tolerated food without any problems? Yes.    Have you been able to return to your normal activities? Yes.    Do you have any questions about your discharge instructions: Diet   No. Medications  No. Follow up visit  No.  Do you have questions or concerns about your Care? No.  Actions: * If pain score is 4 or above: No action needed, pain <4.

## 2015-01-16 ENCOUNTER — Telehealth: Payer: Self-pay | Admitting: Gastroenterology

## 2015-01-16 NOTE — Telephone Encounter (Signed)
She is calling with her 1 week follow up from her EGD. See the procedure note. She had to stop the Bentyl because it was causing nausea. That is better now. However, she started having diarrhea on 01/11/15 and this has not improved. She is not taking Amitiza and has not for "sometime now". She has a liquid stool every morning and then sometime during the day after eating regardless of what she eats. She has had a little reflux. Please advise.

## 2015-01-17 NOTE — Telephone Encounter (Signed)
Begin Imodium 1-2 tabs every morning then every 6 hours when necessary.  DC Imodium if she becomes constipated.

## 2015-01-17 NOTE — Telephone Encounter (Signed)
Advised. Reports continued improvement and no diarrhea so far today. She will hold off on the Imodium for now.

## 2015-01-29 LAB — HM PAP SMEAR

## 2015-01-31 ENCOUNTER — Encounter: Payer: Self-pay | Admitting: Physician Assistant

## 2015-02-21 ENCOUNTER — Other Ambulatory Visit: Payer: Self-pay | Admitting: Family Medicine

## 2015-02-21 DIAGNOSIS — J302 Other seasonal allergic rhinitis: Secondary | ICD-10-CM

## 2015-02-21 MED ORDER — LEVOCETIRIZINE DIHYDROCHLORIDE 5 MG PO TABS: 5.0000 mg | ORAL_TABLET | Freq: Every evening | ORAL | Status: DC

## 2015-03-13 ENCOUNTER — Encounter: Payer: Self-pay | Admitting: Physician Assistant

## 2015-03-13 ENCOUNTER — Ambulatory Visit (INDEPENDENT_AMBULATORY_CARE_PROVIDER_SITE_OTHER): Payer: 59 | Admitting: Physician Assistant

## 2015-03-13 VITALS — BP 104/73 | HR 79 | Temp 98.5°F | Resp 14 | Wt 138.4 lb

## 2015-03-13 DIAGNOSIS — J019 Acute sinusitis, unspecified: Secondary | ICD-10-CM | POA: Diagnosis not present

## 2015-03-13 DIAGNOSIS — B9689 Other specified bacterial agents as the cause of diseases classified elsewhere: Secondary | ICD-10-CM

## 2015-03-13 MED ORDER — AZITHROMYCIN 250 MG PO TABS: ORAL_TABLET | ORAL | Status: DC

## 2015-03-13 NOTE — Progress Notes (Signed)
Pre visit review using our clinic review tool, if applicable. No additional management support is needed unless otherwise documented below in the visit note. 

## 2015-03-13 NOTE — Patient Instructions (Signed)
Please take antibiotic as directed.  Increase fluid intake.  Use Saline nasal spray.  Take a daily multivitamin. Continue Mucinex and Qnasl.  Place a humidifier in the bedroom.  Please call or return clinic if symptoms are not improving.  Sinusitis Sinusitis is redness, soreness, and swelling (inflammation) of the paranasal sinuses. Paranasal sinuses are air pockets within the bones of your face (beneath the eyes, the middle of the forehead, or above the eyes). In healthy paranasal sinuses, mucus is able to drain out, and air is able to circulate through them by way of your nose. However, when your paranasal sinuses are inflamed, mucus and air can become trapped. This can allow bacteria and other germs to grow and cause infection. Sinusitis can develop quickly and last only a short time (acute) or continue over a long period (chronic). Sinusitis that lasts for more than 12 weeks is considered chronic.  CAUSES  Causes of sinusitis include:  Allergies.  Structural abnormalities, such as displacement of the cartilage that separates your nostrils (deviated septum), which can decrease the air flow through your nose and sinuses and affect sinus drainage.  Functional abnormalities, such as when the small hairs (cilia) that line your sinuses and help remove mucus do not work properly or are not present. SYMPTOMS  Symptoms of acute and chronic sinusitis are the same. The primary symptoms are pain and pressure around the affected sinuses. Other symptoms include:  Upper toothache.  Earache.  Headache.  Bad breath.  Decreased sense of smell and taste.  A cough, which worsens when you are lying flat.  Fatigue.  Fever.  Thick drainage from your nose, which often is green and may contain pus (purulent).  Swelling and warmth over the affected sinuses. DIAGNOSIS  Your caregiver will perform a physical exam. During the exam, your caregiver may:  Look in your nose for signs of abnormal growths in  your nostrils (nasal polyps).  Tap over the affected sinus to check for signs of infection.  View the inside of your sinuses (endoscopy) with a special imaging device with a light attached (endoscope), which is inserted into your sinuses. If your caregiver suspects that you have chronic sinusitis, one or more of the following tests may be recommended:  Allergy tests.  Nasal culture A sample of mucus is taken from your nose and sent to a lab and screened for bacteria.  Nasal cytology A sample of mucus is taken from your nose and examined by your caregiver to determine if your sinusitis is related to an allergy. TREATMENT  Most cases of acute sinusitis are related to a viral infection and will resolve on their own within 10 days. Sometimes medicines are prescribed to help relieve symptoms (pain medicine, decongestants, nasal steroid sprays, or saline sprays).  However, for sinusitis related to a bacterial infection, your caregiver will prescribe antibiotic medicines. These are medicines that will help kill the bacteria causing the infection.  Rarely, sinusitis is caused by a fungal infection. In theses cases, your caregiver will prescribe antifungal medicine. For some cases of chronic sinusitis, surgery is needed. Generally, these are cases in which sinusitis recurs more than 3 times per year, despite other treatments. HOME CARE INSTRUCTIONS   Drink plenty of water. Water helps thin the mucus so your sinuses can drain more easily.  Use a humidifier.  Inhale steam 3 to 4 times a day (for example, sit in the bathroom with the shower running).  Apply a warm, moist washcloth to your face 3 to  4 times a day, or as directed by your caregiver.  Use saline nasal sprays to help moisten and clean your sinuses.  Take over-the-counter or prescription medicines for pain, discomfort, or fever only as directed by your caregiver. SEEK IMMEDIATE MEDICAL CARE IF:  You have increasing pain or severe  headaches.  You have nausea, vomiting, or drowsiness.  You have swelling around your face.  You have vision problems.  You have a stiff neck.  You have difficulty breathing. MAKE SURE YOU:   Understand these instructions.  Will watch your condition.  Will get help right away if you are not doing well or get worse. Document Released: 04/20/2005 Document Revised: 07/13/2011 Document Reviewed: 05/05/2011 [facility] Patient Information 2014 [place], [place].

## 2015-03-13 NOTE — Progress Notes (Signed)
Patient presents to clinic today c/o several days of progressively worsening sinus pressure, sinus pain, facial pain, NP cough and PND. Denies fever, chills, chest pain or SOB. Is taking her Qnasl daily.  Past Medical History  Diagnosis Date  . GERD (gastroesophageal reflux disease)   . IBS (irritable bowel syndrome)   . PVC (premature ventricular contraction)   . Migraine   . Allergy   . Anemia   . Blood transfusion without reported diagnosis     at birth    Current Outpatient Prescriptions on File Prior to Visit  Medication Sig Dispense Refill  . cyanocobalamin (,VITAMIN B-12,) 1000 MCG/ML injection Inject 1 ml or 1000 mcg of Vit B12 into muscle monthly for 1 year 12 mL 1  . dicyclomine (BENTYL) 10 MG capsule Take 1 capsule (10 mg total) by mouth 4 (four) times daily -  before meals and at bedtime. 90 capsule 0  . levocetirizine (XYZAL) 5 MG tablet Take 1 tablet (5 mg total) by mouth every evening. 90 tablet 3  . lubiprostone (AMITIZA) 8 MCG capsule Take 8 mcg by mouth 2 (two) times daily with a meal.    . Probiotic Product (ALIGN) 4 MG CAPS Take 4 mg by mouth daily.    . valACYclovir (VALTREX) 1000 MG tablet Take 1 tablet (1,000 mg total) by mouth daily. 90 tablet 1   No current facility-administered medications on file prior to visit.    Allergies  Allergen Reactions  . Aciphex [Rabeprazole Sodium]   . Latex     Family History  Problem Relation Age of Onset  . Arthritis Maternal Grandmother   . Colon cancer Maternal Grandmother   . Hypertension Maternal Grandmother   . Diabetes Maternal Grandmother   . Arthritis Maternal Uncle   . Heart disease Maternal Uncle   . Ovarian cancer Maternal Aunt   . Prostate cancer Maternal Uncle   . Stroke Maternal Grandfather   . Kidney disease Maternal Grandfather   . Heart disease Maternal Grandfather   . Kidney failure Maternal Grandfather   . Diabetes Maternal Grandfather   . Diabetes Paternal Grandmother   . Bone cancer  Paternal Grandmother   . Breast cancer Paternal Grandmother   . Prostate cancer Maternal Uncle   . Colon cancer Maternal Uncle   . Esophageal cancer Neg Hx   . Rectal cancer Neg Hx   . Stomach cancer Neg Hx     Social History   Social History  . Marital Status: Divorced    Spouse Name: N/A  . Number of Children: 2  . Years of Education: N/A   Social History Main Topics  . Smoking status: Never Smoker   . Smokeless tobacco: Never Used  . Alcohol Use: No  . Drug Use: No  . Sexual Activity: Yes    Birth Control/ Protection: Condom   Other Topics Concern  . None   Social History Narrative   Review of Systems - See HPI.  All other ROS are negative.  BP 104/73 mmHg  Pulse 79  Temp(Src) 98.5 F (36.9 C)  Resp 14  Wt 138 lb 6.4 oz (62.778 kg)  SpO2 99%  Physical Exam  Constitutional: She is oriented to person, place, and time and well-developed, well-nourished, and in no distress.  HENT:  Head: Normocephalic and atraumatic.  Right Ear: External ear normal.  Left Ear: External ear normal.  Nose: Nose normal.  Mouth/Throat: Oropharynx is clear and moist. No oropharyngeal exudate.  TM with clear fluid noted  bilaterally. + TTP of sinuses.  Cardiovascular: Normal rate, regular rhythm, normal heart sounds and intact distal pulses.   Pulmonary/Chest: Effort normal and breath sounds normal. No respiratory distress. She has no wheezes. She has no rales. She exhibits no tenderness.  Neurological: She is alert and oriented to person, place, and time.  Skin: Skin is warm and dry. No rash noted.  Psychiatric: Affect normal.  Vitals reviewed.   Recent Results (from the past 2160 hour(s))  HM PAP SMEAR     Status: None   Collection Time: 01/29/15 12:00 AM  Result Value Ref Range   HM Pap smear Done     Assessment/Plan: Acute bacterial sinusitis: Rx Azithromycin.  Increase fluids.  Rest.  Saline nasal spray.  Probiotic.  Mucinex as directed.  Humidifier in bedroom. Continue  allergy medications.  Call or return to clinic if symptoms are not improving.

## 2015-03-14 ENCOUNTER — Ambulatory Visit (INDEPENDENT_AMBULATORY_CARE_PROVIDER_SITE_OTHER): Payer: 59

## 2015-03-14 DIAGNOSIS — J329 Chronic sinusitis, unspecified: Secondary | ICD-10-CM | POA: Diagnosis not present

## 2015-03-14 DIAGNOSIS — B9689 Other specified bacterial agents as the cause of diseases classified elsewhere: Secondary | ICD-10-CM

## 2015-03-14 DIAGNOSIS — A499 Bacterial infection, unspecified: Secondary | ICD-10-CM

## 2015-03-14 MED ORDER — METHYLPREDNISOLONE ACETATE 80 MG/ML IJ SUSP
80.0000 mg | Freq: Once | INTRAMUSCULAR | Status: AC
  Administered 2015-03-14: 80 mg via INTRAMUSCULAR

## 2015-04-25 IMAGING — MG MM DIAG BREAST TOMO BILATERAL
8 of 12 series · 8 of 28 positions shown · non-contrast
Comparison: None.

CLINICAL DATA: Patient has been experiencing left nipple pain.

EXAM:
DIGITAL DIAGNOSTIC BILATERAL MAMMOGRAM WITH TOMOSYNTHESIS AND CAD
ULTRASOUND RIGHT BREAST

[L CC synth-2D]
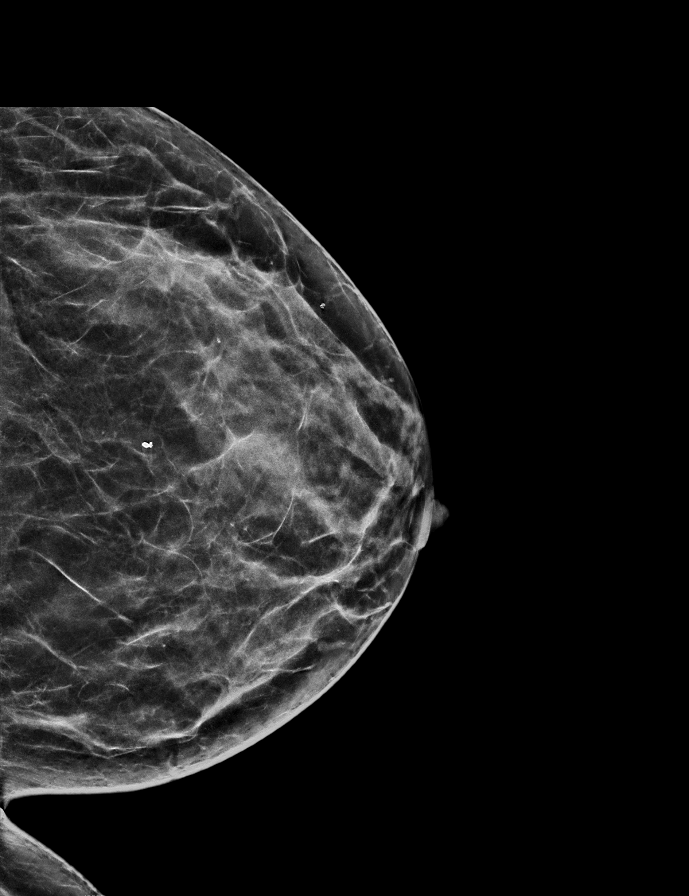

[R MLO]
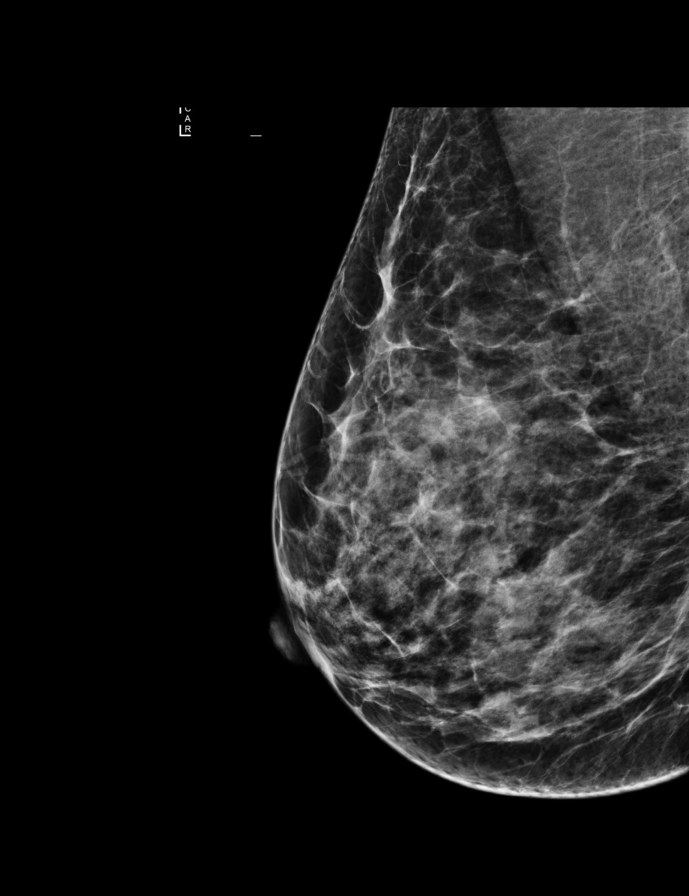

[L MLO]
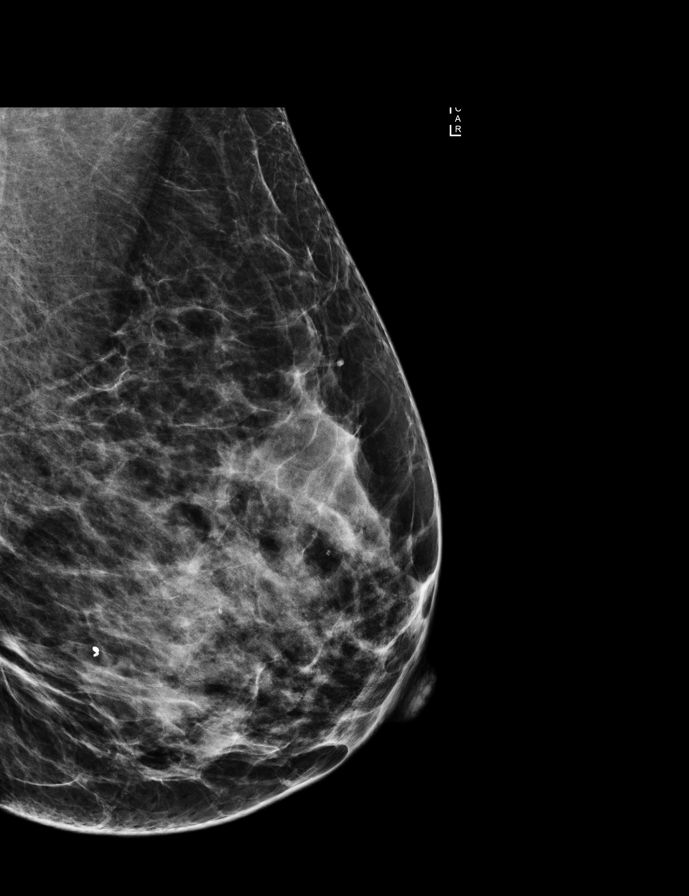

[L CC]
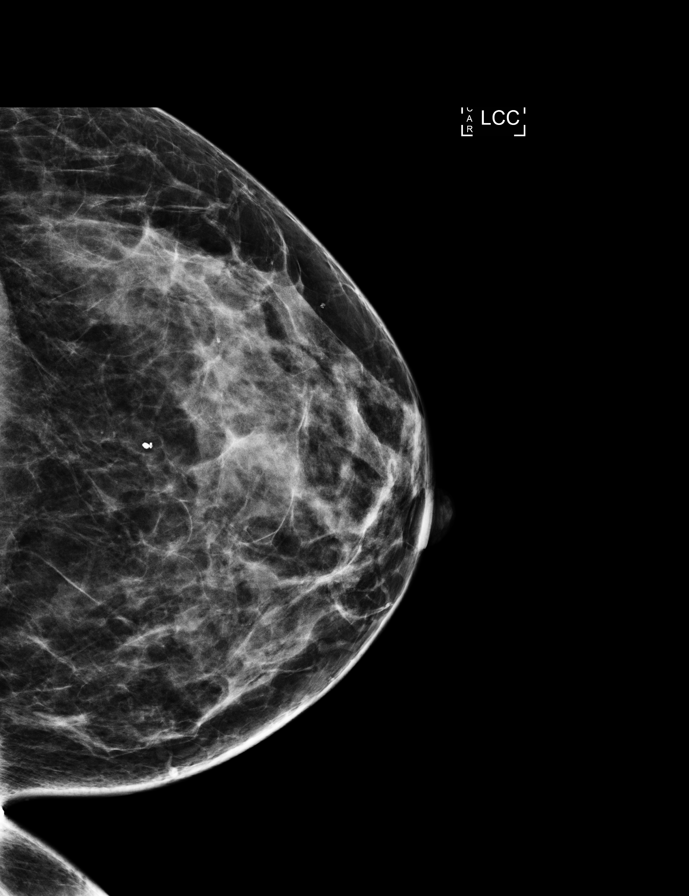

[R CC synth-2D]
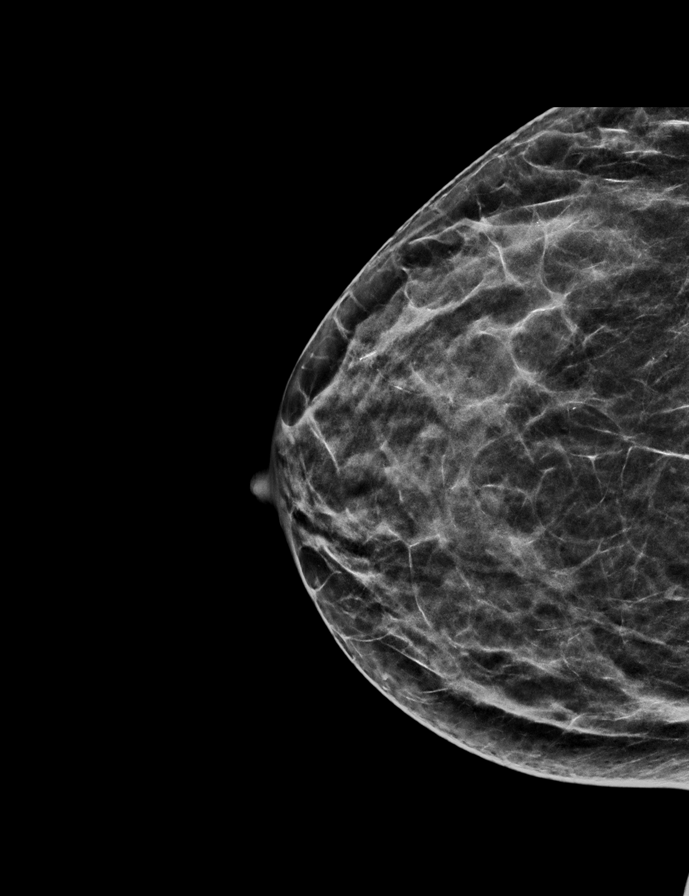

[L MLO synth-2D]
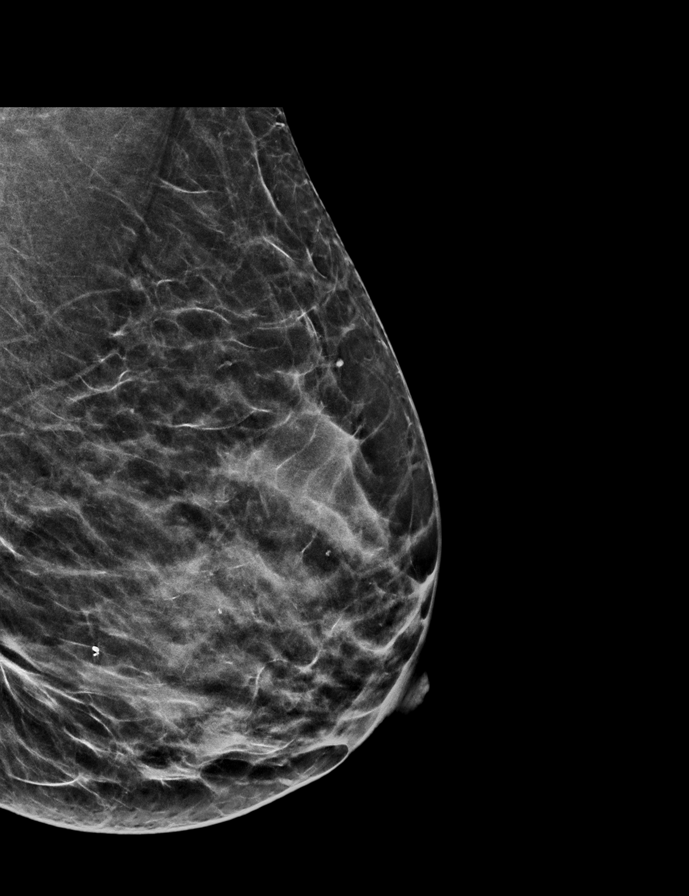

[R CC]
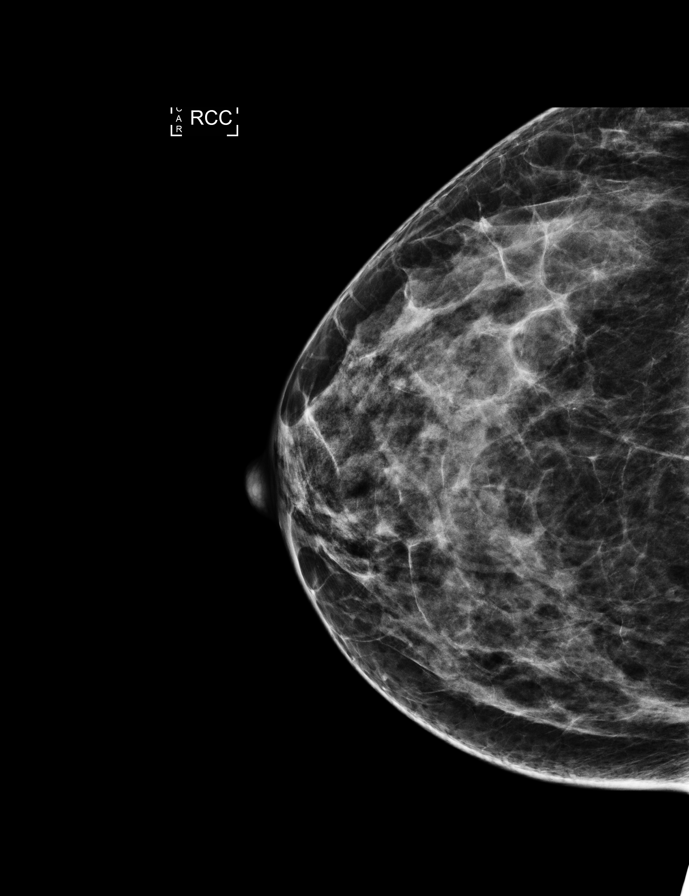

[R MLO synth-2D]
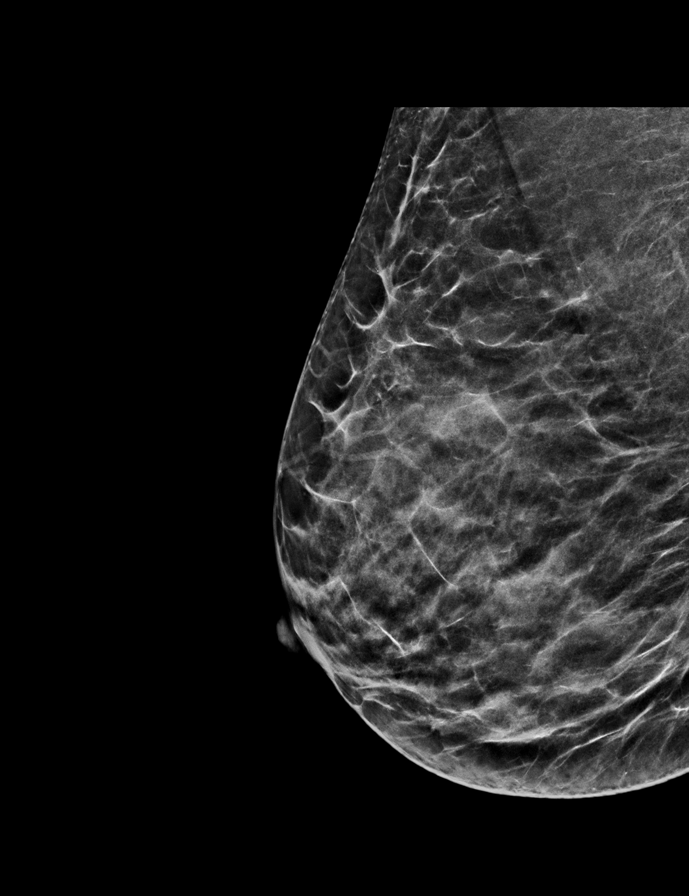

[8 of 28 positions shown; findings below may reference images not displayed]

ACR Breast Density Category c: The breast tissue is heterogeneously
dense, which may obscure small masses.
FINDINGS: There is an oval circumscribed mass in the 3 o'clock position of the
right breast at middle depth. There is an oval mass containing
coarse calcifications consistent with a benign hyalinized
fibroadenoma in the 6 o'clock position of the left breast
posteriorly. No other abnormality is noted.

Mammographic images were processed with CAD.

On physical exam, no mass is palpated in the right breast..

Ultrasound is performed, showing an oval horizontally oriented
well-defined homogeneous solid mass at 3 o'clock 4 cm from the right
nipple measuring 9 x 5 x 9 mm. There is a second similar mass at 3
o'clock 2 cm from the right nipple measuring 6 x 6 x 4 mm. Each of
these is very likely a benign fibroadenoma. Options for management
discussed with the patient include short interval followup
ultrasound of the right breast (6, 12, 24 months), ultrasound-guided
core needle biopsy, or surgical excisional biopsy. I recommended
short interval follow-up ultrasound. The patient felt comfortable
with this plan.
IMPRESSION: Probably benign fibroadenomas in the 3 o'clock position of the right
breast.

RECOMMENDATION:
Right breast ultrasound in 6, 12, and 24 months.

I have discussed the findings and recommendations with the patient.
Results were also provided in writing at the conclusion of the
visit. If applicable, a reminder letter will be sent to the patient
regarding the next appointment.

BI-RADS CATEGORY  3: Probably benign finding(s) - short interval
follow-up suggested.

## 2015-05-08 ENCOUNTER — Other Ambulatory Visit: Payer: Self-pay | Admitting: Physician Assistant

## 2015-05-08 MED ORDER — PROBIOTIC PEARLS PO CAPS: ORAL_CAPSULE | ORAL | Status: DC

## 2015-05-29 ENCOUNTER — Other Ambulatory Visit: Payer: Self-pay | Admitting: Family Medicine

## 2015-05-29 DIAGNOSIS — D241 Benign neoplasm of right breast: Secondary | ICD-10-CM

## 2015-06-19 ENCOUNTER — Ambulatory Visit (INDEPENDENT_AMBULATORY_CARE_PROVIDER_SITE_OTHER): Payer: 59 | Admitting: Gastroenterology

## 2015-06-19 ENCOUNTER — Encounter: Payer: Self-pay | Admitting: Gastroenterology

## 2015-06-19 VITALS — BP 94/58 | HR 76 | Ht 63.0 in | Wt 135.0 lb

## 2015-06-19 DIAGNOSIS — R1310 Dysphagia, unspecified: Secondary | ICD-10-CM

## 2015-06-19 DIAGNOSIS — R1013 Epigastric pain: Secondary | ICD-10-CM

## 2015-06-19 NOTE — Progress Notes (Signed)
Anna [name]    [id number]    11-06-1980  Primary Care Physician:Yvonne Etter Sjogren, DO  Referring Physician: Rosalita Chessman, DO [address] [place], [place] [address]  Chief complaint:  Dysphagia, Epigastric pain HPI: 35 year old female here for follow up visit with c/o dysphagia and epigastric pain. On Superbowl Sunday when she ate hot wings, felt they were just sitting in middle of her chest, could not get them down and she had throw up, woke up that night with severe pain in epigastric region radiating to back. She has occasional heartburn and takens Zantac or  Omeprazole as needed. She continues to have nausea and epigastric pain. She has intermittent sensation of food stuck in her chest about once every few months. Gastric emptying scan was normal. 2 E prior EGD, most recent in Sep 2016 unremarkable. Colonoscopy was normal. Denies any melena or bright red blood per rectum. Normal HIDA scan    Outpatient Encounter Prescriptions as of 06/19/2015  Medication Sig  . cyanocobalamin (,VITAMIN B-12,) 1000 MCG/ML injection Inject 1 ml or 1000 mcg of Vit B12 into muscle monthly for 1 year  . levocetirizine (XYZAL) 5 MG tablet Take 1 tablet (5 mg total) by mouth every evening.  . Probiotic Product (ALIGN) 4 MG CAPS Take 1 capsule by mouth every other day.  . Probiotic Product (PROBIOTIC PEARLS) CAPS Take 1 capsule by mouth daily  . azithromycin (ZITHROMAX) 250 MG tablet Take 2 tablets on Day 1. Then take 1 tablet daily.  . [DISCONTINUED] dicyclomine (BENTYL) 10 MG capsule Take 1 capsule (10 mg total) by mouth 4 (four) times daily -  before meals and at bedtime. (Patient not taking: Reported on 06/19/2015)  . [DISCONTINUED] lubiprostone (AMITIZA) 8 MCG capsule Take 8 mcg by mouth 2 (two) times daily with a meal. Reported on 06/19/2015   No facility-administered encounter medications on file as of 06/19/2015.    Allergies as of 06/19/2015 - Review Complete 06/19/2015    Allergen Reaction Noted  . Aciphex [rabeprazole sodium]  09/23/2010  . Latex  09/23/2010    Past Medical History  Diagnosis Date  . GERD (gastroesophageal reflux disease)   . IBS (irritable bowel syndrome)   . PVC (premature ventricular contraction)   . Migraine   . Allergy   . Anemia   . Blood transfusion without reported diagnosis     at birth    Past Surgical History  Procedure Laterality Date  . Wisdom tooth extraction  2010  . Tubal ligation  06-04-2002  . Endometrial ablation      Family History  Problem Relation Age of Onset  . Arthritis Maternal Grandmother   . Colon cancer Maternal Grandmother   . Hypertension Maternal Grandmother   . Diabetes Maternal Grandmother   . Arthritis Maternal Uncle   . Heart disease Maternal Uncle   . Ovarian cancer Maternal Aunt   . Prostate cancer Maternal Uncle   . Stroke Maternal Grandfather   . Kidney disease Maternal Grandfather   . Heart disease Maternal Grandfather   . Kidney failure Maternal Grandfather   . Diabetes Maternal Grandfather   . Diabetes Paternal Grandmother   . Bone cancer Paternal Grandmother   . Breast cancer Paternal Grandmother   . Prostate cancer Maternal Uncle   . Colon cancer Maternal Uncle   . Esophageal cancer Neg Hx   . Rectal cancer Neg Hx   . Stomach cancer Neg Hx  Social History   Social History  . Marital Status: Divorced    Spouse Name: N/A  . Number of Children: 2  . Years of Education: N/A   Occupational History  . Not on file.   Social History Main Topics  . Smoking status: Never Smoker   . Smokeless tobacco: Never Used  . Alcohol Use: No  . Drug Use: No  . Sexual Activity: Yes    Birth Control/ Protection: Condom   Other Topics Concern  . Not on file   Social History Narrative      Review of systems: Review of Systems  Constitutional: Negative for fever and chills.  HENT: Negative.   Eyes: Negative for blurred vision.  Respiratory: Negative for cough,  shortness of breath and wheezing.   Cardiovascular: Negative for chest pain and palpitations.  Gastrointestinal: as per HPI Genitourinary: Negative for dysuria, urgency, frequency and hematuria.  Musculoskeletal: Negative for myalgias, back pain and joint pain.  Skin: Negative for itching and rash.  Neurological: Negative for dizziness, tremors, focal weakness, seizures and loss of consciousness.  Endo/Heme/Allergies: Negative for environmental allergies.  Psychiatric/Behavioral: Negative for depression, suicidal ideas and hallucinations.  All other systems reviewed and are negative.   Physical Exam: Filed Vitals:   06/19/15 1118  BP: 94/58  Pulse: 76   Gen:      No acute distress HEENT:  EOMI, sclera anicteric Neck:     No masses; no thyromegaly Lungs:    Clear to auscultation bilaterally; normal respiratory effort CV:         Regular rate and rhythm; no murmurs Abd:      + bowel sounds; soft, non-tender; no palpable masses, no distension Ext:    No edema; adequate peripheral perfusion Skin:      Warm and dry; no rash Neuro: alert and oriented x 3 Psych: normal mood and affect  Data Reviewed:  As per HPI   Assessment and Plan/Recommendations: 23 yr F with h/o IBS here with c/o dysphagia and ?food impaction Will schedule for EGD with biopsies and possible dilation She never had esophageal biopsies to exclude eosinophilic esophagitis, can have a subtle Schatzki's ring causing intermittent solid food dysphagia. No difficulty with liquids or soft diet PPI daily Return in 3 months  K. Denzil Magnuson , MD [phone] Mon-Fri 8a-5p [phone] after 5p, weekends, holidays

## 2015-06-19 NOTE — Patient Instructions (Signed)
You have been scheduled for an endoscopy. Please follow written instructions given to you at your visit today. If you use inhalers (even only as needed), please bring them with you on the day of your procedure. Your physician has requested that you go to www.startemmi.com and enter the access code given to you at your visit today. This web site gives a general overview about your procedure. However, you should still follow specific instructions given to you by our office regarding your preparation for the procedure.  Follow up in 3 months

## 2015-07-10 ENCOUNTER — Other Ambulatory Visit: Payer: Self-pay | Admitting: Family Medicine

## 2015-07-10 ENCOUNTER — Other Ambulatory Visit: Payer: Self-pay | Admitting: Behavioral Health

## 2015-07-10 ENCOUNTER — Ambulatory Visit
Admission: RE | Admit: 2015-07-10 | Discharge: 2015-07-10 | Disposition: A | Discharge status: Home / Self Care | Payer: 59 | Source: Ambulatory Visit | Attending: Family Medicine | Admitting: Family Medicine

## 2015-07-10 DIAGNOSIS — N63 Unspecified lump in breast: Secondary | ICD-10-CM | POA: Diagnosis not present

## 2015-07-10 DIAGNOSIS — N6310 Unspecified lump in the right breast, unspecified quadrant: Secondary | ICD-10-CM

## 2015-07-10 DIAGNOSIS — D241 Benign neoplasm of right breast: Secondary | ICD-10-CM

## 2015-07-10 MED ORDER — VALACYCLOVIR HCL 1 G PO TABS: 1000.0000 mg | ORAL_TABLET | Freq: Every day | ORAL | Status: AC

## 2015-07-15 ENCOUNTER — Other Ambulatory Visit: Payer: Self-pay | Admitting: Family Medicine

## 2015-07-15 ENCOUNTER — Other Ambulatory Visit (INDEPENDENT_AMBULATORY_CARE_PROVIDER_SITE_OTHER): Payer: 59

## 2015-07-15 DIAGNOSIS — R5382 Chronic fatigue, unspecified: Secondary | ICD-10-CM

## 2015-07-15 LAB — CBC WITH DIFFERENTIAL/PLATELET
BASOS ABS: 0 10*3/uL (ref 0.0–0.1)
Basophils Relative: 0.5 % (ref 0.0–3.0)
EOS ABS: 0.3 10*3/uL (ref 0.0–0.7)
Eosinophils Relative: 8.6 % — ABNORMAL HIGH (ref 0.0–5.0)
HEMATOCRIT: 35.9 % — AB (ref 36.0–46.0)
HEMOGLOBIN: 11.9 g/dL — AB (ref 12.0–15.0)
LYMPHS PCT: 44.3 % (ref 12.0–46.0)
Lymphs Abs: 1.5 10*3/uL (ref 0.7–4.0)
MCHC: 33.2 g/dL (ref 30.0–36.0)
MCV: 88.7 fl (ref 78.0–100.0)
Monocytes Absolute: 0.2 10*3/uL (ref 0.1–1.0)
Monocytes Relative: 6.1 % (ref 3.0–12.0)
NEUTROS ABS: 1.4 10*3/uL (ref 1.4–7.7)
Neutrophils Relative %: 40.5 % — ABNORMAL LOW (ref 43.0–77.0)
PLATELETS: 216 10*3/uL (ref 150.0–400.0)
RBC: 4.04 Mil/uL (ref 3.87–5.11)
RDW: 14.2 % (ref 11.5–15.5)
WBC: 3.5 10*3/uL — AB (ref 4.0–10.5)

## 2015-07-15 LAB — THYROID PANEL WITH TSH
Free Thyroxine Index: 1.9 (ref 1.4–3.8)
T3 UPTAKE: 32 % (ref 22–35)
T4, Total: 5.9 ug/dL (ref 4.5–12.0)
TSH: 1.3 mIU/L

## 2015-07-15 LAB — MONONUCLEOSIS SCREEN: Mono Screen: NEGATIVE

## 2015-07-15 LAB — VITAMIN B12: Vitamin B-12: 485 pg/mL (ref 211–911)

## 2015-07-16 LAB — EPSTEIN-BARR VIRUS VCA ANTIBODY PANEL
EBV EA IgG: <5 U/mL (ref <9.0)
EBV NA IgG: >600 U/mL — ABNORMAL HIGH (ref <18.0)
EBV VCA IgG: 712 U/mL — ABNORMAL HIGH (ref <18.0)

## 2015-07-18 ENCOUNTER — Other Ambulatory Visit: Payer: Self-pay | Admitting: Behavioral Health

## 2015-07-18 LAB — VITAMIN D 1,25 DIHYDROXY
VITAMIN D 1, 25 (OH) TOTAL: 25 pg/mL (ref 18–72)
VITAMIN D3 1, 25 (OH): 25 pg/mL
Vitamin D2 1, 25 (OH)2: <8 pg/mL

## 2015-07-19 ENCOUNTER — Other Ambulatory Visit: Payer: Self-pay | Admitting: Behavioral Health

## 2015-07-19 MED ORDER — ERGOCALCIFEROL 1.25 MG (50000 UT) PO CAPS: 50000.0000 [IU] | ORAL_CAPSULE | ORAL | Status: DC

## 2015-07-24 ENCOUNTER — Ambulatory Visit
Admission: RE | Admit: 2015-07-24 | Discharge: 2015-07-24 | Disposition: A | Discharge status: Home / Self Care | Payer: 59 | Source: Ambulatory Visit | Attending: Family Medicine | Admitting: Family Medicine

## 2015-07-24 ENCOUNTER — Other Ambulatory Visit: Payer: Self-pay | Admitting: Family Medicine

## 2015-07-24 DIAGNOSIS — D241 Benign neoplasm of right breast: Secondary | ICD-10-CM | POA: Diagnosis not present

## 2015-07-24 DIAGNOSIS — N6310 Unspecified lump in the right breast, unspecified quadrant: Secondary | ICD-10-CM

## 2015-07-24 DIAGNOSIS — N63 Unspecified lump in breast: Secondary | ICD-10-CM | POA: Diagnosis not present

## 2015-08-07 ENCOUNTER — Encounter: Payer: 59 | Admitting: Gastroenterology

## 2015-08-22 ENCOUNTER — Other Ambulatory Visit: Payer: Self-pay | Admitting: General Surgery

## 2015-08-22 DIAGNOSIS — N63 Unspecified lump in breast: Secondary | ICD-10-CM | POA: Diagnosis not present

## 2015-08-22 DIAGNOSIS — N6310 Unspecified lump in the right breast, unspecified quadrant: Secondary | ICD-10-CM

## 2015-08-22 DIAGNOSIS — K8050 Calculus of bile duct without cholangitis or cholecystitis without obstruction: Secondary | ICD-10-CM | POA: Diagnosis not present

## 2015-08-28 ENCOUNTER — Other Ambulatory Visit: Payer: Self-pay | Admitting: General Surgery

## 2015-08-28 DIAGNOSIS — N6310 Unspecified lump in the right breast, unspecified quadrant: Secondary | ICD-10-CM

## 2015-09-02 HISTORY — PX: BREAST LUMPECTOMY: SHX2

## 2015-09-05 ENCOUNTER — Encounter (HOSPITAL_BASED_OUTPATIENT_CLINIC_OR_DEPARTMENT_OTHER): Payer: Self-pay | Admitting: *Deleted

## 2015-09-11 ENCOUNTER — Ambulatory Visit
Admission: RE | Admit: 2015-09-11 | Discharge: 2015-09-11 | Disposition: A | Discharge status: Home / Self Care | Payer: 59 | Source: Ambulatory Visit | Attending: General Surgery | Admitting: General Surgery

## 2015-09-11 ENCOUNTER — Encounter (HOSPITAL_BASED_OUTPATIENT_CLINIC_OR_DEPARTMENT_OTHER)
Admission: RE | Admit: 2015-09-11 | Discharge: 2015-09-11 | Disposition: A | Discharge status: Home / Self Care | Payer: 59 | Source: Ambulatory Visit | Attending: General Surgery | Admitting: General Surgery

## 2015-09-11 DIAGNOSIS — K219 Gastro-esophageal reflux disease without esophagitis: Secondary | ICD-10-CM | POA: Diagnosis not present

## 2015-09-11 DIAGNOSIS — N6310 Unspecified lump in the right breast, unspecified quadrant: Secondary | ICD-10-CM

## 2015-09-11 DIAGNOSIS — D241 Benign neoplasm of right breast: Secondary | ICD-10-CM | POA: Diagnosis not present

## 2015-09-11 DIAGNOSIS — K824 Cholesterolosis of gallbladder: Secondary | ICD-10-CM | POA: Diagnosis not present

## 2015-09-11 LAB — CBC WITH DIFFERENTIAL/PLATELET
Basophils Absolute: 0 10*3/uL (ref 0.0–0.1)
Basophils Relative: 0 %
EOS ABS: 0.1 10*3/uL (ref 0.0–0.7)
Eosinophils Relative: 5 %
HCT: 37 % (ref 36.0–46.0)
Hemoglobin: 12.2 g/dL (ref 12.0–15.0)
LYMPHS ABS: 1.6 10*3/uL (ref 0.7–4.0)
Lymphocytes Relative: 56 %
MCH: 30 pg (ref 26.0–34.0)
MCHC: 33 g/dL (ref 30.0–36.0)
MCV: 91.1 fL (ref 78.0–100.0)
MONO ABS: 0.2 10*3/uL (ref 0.1–1.0)
Monocytes Relative: 6 %
NEUTROS PCT: 33 %
Neutro Abs: 0.9 10*3/uL — ABNORMAL LOW (ref 1.7–7.7)
PLATELETS: 217 10*3/uL (ref 150–400)
RBC: 4.06 MIL/uL (ref 3.87–5.11)
RDW: 13.1 % (ref 11.5–15.5)
WBC: 2.8 10*3/uL — AB (ref 4.0–10.5)

## 2015-09-11 LAB — COMPREHENSIVE METABOLIC PANEL
ALT: 11 U/L — AB (ref 14–54)
AST: 19 U/L (ref 15–41)
Albumin: 3.9 g/dL (ref 3.5–5.0)
Alkaline Phosphatase: 35 U/L — ABNORMAL LOW (ref 38–126)
Anion gap: 7 (ref 5–15)
BUN: 7 mg/dL (ref 6–20)
CHLORIDE: 103 mmol/L (ref 101–111)
CO2: 30 mmol/L (ref 22–32)
CREATININE: 0.71 mg/dL (ref 0.44–1.00)
Calcium: 9.8 mg/dL (ref 8.9–10.3)
GFR calc Af Amer: >60 mL/min (ref >60)
GFR calc non Af Amer: >60 mL/min (ref >60)
Glucose, Bld: 83 mg/dL (ref 65–99)
Potassium: 3.9 mmol/L (ref 3.5–5.1)
SODIUM: 140 mmol/L (ref 135–145)
TOTAL PROTEIN: 6.9 g/dL (ref 6.5–8.1)
Total Bilirubin: 0.6 mg/dL (ref 0.3–1.2)

## 2015-09-11 NOTE — Pre-Procedure Instructions (Signed)
Pt did not get carb drink due to Laproscopic Cholecystectomy.

## 2015-09-11 NOTE — Progress Notes (Signed)
Lab results given to Robbin RN at Dr. Cristal Generous office. She will tell him pt's WBC's are 2.8 and Neut 0.9.  Pt for surgery in AM.

## 2015-09-12 ENCOUNTER — Ambulatory Visit (HOSPITAL_BASED_OUTPATIENT_CLINIC_OR_DEPARTMENT_OTHER): Payer: 59 | Admitting: Anesthesiology

## 2015-09-12 ENCOUNTER — Ambulatory Visit (HOSPITAL_BASED_OUTPATIENT_CLINIC_OR_DEPARTMENT_OTHER)
Admission: RE | Admit: 2015-09-12 | Discharge: 2015-09-12 | Disposition: A | Discharge status: Home / Self Care | Payer: 59 | Source: Ambulatory Visit | Attending: General Surgery | Admitting: General Surgery

## 2015-09-12 ENCOUNTER — Encounter (HOSPITAL_BASED_OUTPATIENT_CLINIC_OR_DEPARTMENT_OTHER): Payer: Self-pay | Admitting: *Deleted

## 2015-09-12 ENCOUNTER — Encounter (HOSPITAL_BASED_OUTPATIENT_CLINIC_OR_DEPARTMENT_OTHER)
Admission: RE | Disposition: A | Discharge status: Home / Self Care | Payer: Self-pay | Source: Ambulatory Visit | Attending: General Surgery

## 2015-09-12 ENCOUNTER — Ambulatory Visit
Admission: RE | Admit: 2015-09-12 | Discharge: 2015-09-12 | Disposition: A | Discharge status: Home / Self Care | Payer: 59 | Source: Ambulatory Visit | Attending: General Surgery | Admitting: General Surgery

## 2015-09-12 DIAGNOSIS — D241 Benign neoplasm of right breast: Secondary | ICD-10-CM | POA: Diagnosis not present

## 2015-09-12 DIAGNOSIS — K219 Gastro-esophageal reflux disease without esophagitis: Secondary | ICD-10-CM | POA: Insufficient documentation

## 2015-09-12 DIAGNOSIS — K811 Chronic cholecystitis: Secondary | ICD-10-CM | POA: Diagnosis not present

## 2015-09-12 DIAGNOSIS — K8050 Calculus of bile duct without cholangitis or cholecystitis without obstruction: Secondary | ICD-10-CM | POA: Diagnosis not present

## 2015-09-12 DIAGNOSIS — K819 Cholecystitis, unspecified: Secondary | ICD-10-CM | POA: Diagnosis not present

## 2015-09-12 DIAGNOSIS — N6310 Unspecified lump in the right breast, unspecified quadrant: Secondary | ICD-10-CM

## 2015-09-12 DIAGNOSIS — K824 Cholesterolosis of gallbladder: Secondary | ICD-10-CM | POA: Insufficient documentation

## 2015-09-12 HISTORY — PX: CHOLECYSTECTOMY: SHX55

## 2015-09-12 HISTORY — PX: RADIOACTIVE SEED GUIDED EXCISIONAL BREAST BIOPSY: SHX6490

## 2015-09-12 LAB — PATHOLOGIST SMEAR REVIEW

## 2015-09-12 SURGERY — LAPAROSCOPIC CHOLECYSTECTOMY
Anesthesia: General | Site: Breast | Laterality: Right

## 2015-09-12 MED ORDER — LACTATED RINGERS IV SOLN
INTRAVENOUS | Status: DC
  Administered 2015-09-12: 07:00:00 via INTRAVENOUS

## 2015-09-12 MED ORDER — MIDAZOLAM HCL 2 MG/2ML IJ SOLN
INTRAMUSCULAR | Status: AC
  Filled 2015-09-12: qty 2

## 2015-09-12 MED ORDER — HYDROMORPHONE HCL 1 MG/ML IJ SOLN
0.2500 mg | INTRAMUSCULAR | Status: DC | PRN
  Administered 2015-09-12 (×3): 0.5 mg via INTRAVENOUS

## 2015-09-12 MED ORDER — BUPIVACAINE-EPINEPHRINE (PF) 0.5% -1:200000 IJ SOLN
INTRAMUSCULAR | Status: AC
  Filled 2015-09-12: qty 30

## 2015-09-12 MED ORDER — CEFAZOLIN SODIUM-DEXTROSE 2-4 GM/100ML-% IV SOLN
INTRAVENOUS | Status: AC
  Filled 2015-09-12: qty 100

## 2015-09-12 MED ORDER — DEXAMETHASONE SODIUM PHOSPHATE 10 MG/ML IJ SOLN
INTRAMUSCULAR | Status: AC
  Filled 2015-09-12: qty 1

## 2015-09-12 MED ORDER — BUPIVACAINE-EPINEPHRINE (PF) 0.25% -1:200000 IJ SOLN
INTRAMUSCULAR | Status: AC
  Filled 2015-09-12: qty 90

## 2015-09-12 MED ORDER — KETOROLAC TROMETHAMINE 30 MG/ML IJ SOLN
INTRAMUSCULAR | Status: AC
  Filled 2015-09-12: qty 1

## 2015-09-12 MED ORDER — SODIUM CHLORIDE 0.9 % IR SOLN
Status: DC | PRN
  Administered 2015-09-12: 150 mL

## 2015-09-12 MED ORDER — PROPOFOL 10 MG/ML IV BOLUS
INTRAVENOUS | Status: AC
  Filled 2015-09-12: qty 40

## 2015-09-12 MED ORDER — HYDROMORPHONE HCL 1 MG/ML IJ SOLN
INTRAMUSCULAR | Status: AC
  Filled 2015-09-12: qty 1

## 2015-09-12 MED ORDER — SUCCINYLCHOLINE CHLORIDE 200 MG/10ML IV SOSY
PREFILLED_SYRINGE | INTRAVENOUS | Status: AC
  Filled 2015-09-12: qty 10

## 2015-09-12 MED ORDER — LIDOCAINE 2% (20 MG/ML) 5 ML SYRINGE
INTRAMUSCULAR | Status: AC
  Filled 2015-09-12: qty 10

## 2015-09-12 MED ORDER — OXYCODONE-ACETAMINOPHEN 10-325 MG PO TABS: 1.0000 | ORAL_TABLET | Freq: Four times a day (QID) | ORAL | Status: DC | PRN

## 2015-09-12 MED ORDER — SUGAMMADEX SODIUM 500 MG/5ML IV SOLN
INTRAVENOUS | Status: AC
  Filled 2015-09-12: qty 5

## 2015-09-12 MED ORDER — MIDAZOLAM HCL 2 MG/2ML IJ SOLN
1.0000 mg | INTRAMUSCULAR | Status: DC | PRN
  Administered 2015-09-12: 2 mg via INTRAVENOUS

## 2015-09-12 MED ORDER — FENTANYL CITRATE (PF) 100 MCG/2ML IJ SOLN
INTRAMUSCULAR | Status: AC
  Filled 2015-09-12: qty 4

## 2015-09-12 MED ORDER — SODIUM CHLORIDE 0.9 % IJ SOLN
INTRAMUSCULAR | Status: AC
  Filled 2015-09-12: qty 10

## 2015-09-12 MED ORDER — FENTANYL CITRATE (PF) 100 MCG/2ML IJ SOLN
INTRAMUSCULAR | Status: AC
  Filled 2015-09-12: qty 2

## 2015-09-12 MED ORDER — ONDANSETRON HCL 4 MG/2ML IJ SOLN
INTRAMUSCULAR | Status: AC
  Filled 2015-09-12: qty 2

## 2015-09-12 MED ORDER — LIDOCAINE HCL (PF) 1 % IJ SOLN
INTRAMUSCULAR | Status: AC
  Filled 2015-09-12: qty 30

## 2015-09-12 MED ORDER — SCOPOLAMINE 1 MG/3DAYS TD PT72
1.0000 | MEDICATED_PATCH | Freq: Once | TRANSDERMAL | Status: AC | PRN
  Administered 2015-09-12: 1 via TRANSDERMAL

## 2015-09-12 MED ORDER — SUGAMMADEX SODIUM 200 MG/2ML IV SOLN
INTRAVENOUS | Status: DC | PRN
  Administered 2015-09-12: 200 mg via INTRAVENOUS

## 2015-09-12 MED ORDER — KETOROLAC TROMETHAMINE 30 MG/ML IJ SOLN
INTRAMUSCULAR | Status: DC | PRN
  Administered 2015-09-12: 30 mg via INTRAVENOUS

## 2015-09-12 MED ORDER — SCOPOLAMINE 1 MG/3DAYS TD PT72
MEDICATED_PATCH | TRANSDERMAL | Status: AC
  Filled 2015-09-12: qty 1

## 2015-09-12 MED ORDER — CEFAZOLIN SODIUM-DEXTROSE 2-4 GM/100ML-% IV SOLN
2.0000 g | INTRAVENOUS | Status: AC
  Administered 2015-09-12: 2 g via INTRAVENOUS

## 2015-09-12 MED ORDER — ONDANSETRON HCL 4 MG/2ML IJ SOLN
INTRAMUSCULAR | Status: DC | PRN
  Administered 2015-09-12: 4 mg via INTRAVENOUS

## 2015-09-12 MED ORDER — ROCURONIUM BROMIDE 100 MG/10ML IV SOLN
INTRAVENOUS | Status: DC | PRN
  Administered 2015-09-12: 30 mg via INTRAVENOUS

## 2015-09-12 MED ORDER — PROMETHAZINE HCL 25 MG/ML IJ SOLN
6.2500 mg | INTRAMUSCULAR | Status: DC | PRN
  Administered 2015-09-12: 6.25 mg via INTRAVENOUS

## 2015-09-12 MED ORDER — PHENYLEPHRINE 40 MCG/ML (10ML) SYRINGE FOR IV PUSH (FOR BLOOD PRESSURE SUPPORT)
PREFILLED_SYRINGE | INTRAVENOUS | Status: AC
  Filled 2015-09-12: qty 10

## 2015-09-12 MED ORDER — LIDOCAINE HCL (CARDIAC) 20 MG/ML IV SOLN
INTRAVENOUS | Status: DC | PRN
  Administered 2015-09-12: 70 mg via INTRAVENOUS

## 2015-09-12 MED ORDER — BUPIVACAINE-EPINEPHRINE 0.25% -1:200000 IJ SOLN
INTRAMUSCULAR | Status: DC | PRN
  Administered 2015-09-12: 20 mL

## 2015-09-12 MED ORDER — ROCURONIUM BROMIDE 50 MG/5ML IV SOLN
INTRAVENOUS | Status: AC
  Filled 2015-09-12: qty 1

## 2015-09-12 MED ORDER — PROPOFOL 10 MG/ML IV BOLUS
INTRAVENOUS | Status: DC | PRN
  Administered 2015-09-12: 150 mg via INTRAVENOUS

## 2015-09-12 MED ORDER — LIDOCAINE HCL 4 % EX SOLN
CUTANEOUS | Status: DC | PRN
  Administered 2015-09-12: 2.5 mL via TOPICAL

## 2015-09-12 MED ORDER — GLYCOPYRROLATE 0.2 MG/ML IJ SOLN: 0.2000 mg | Freq: Once | INTRAMUSCULAR | Status: DC | PRN

## 2015-09-12 MED ORDER — PROMETHAZINE HCL 25 MG/ML IJ SOLN
INTRAMUSCULAR | Status: AC
  Filled 2015-09-12: qty 1

## 2015-09-12 MED ORDER — OXYCODONE HCL 5 MG/5ML PO SOLN: 5.0000 mg | Freq: Once | ORAL | Status: DC | PRN

## 2015-09-12 MED ORDER — FENTANYL CITRATE (PF) 100 MCG/2ML IJ SOLN
50.0000 ug | INTRAMUSCULAR | Status: AC | PRN
  Administered 2015-09-12: 50 ug via INTRAVENOUS
  Administered 2015-09-12 (×2): 25 ug via INTRAVENOUS

## 2015-09-12 MED ORDER — SUCCINYLCHOLINE CHLORIDE 20 MG/ML IJ SOLN
INTRAMUSCULAR | Status: DC | PRN
  Administered 2015-09-12: 100 mg via INTRAVENOUS

## 2015-09-12 MED ORDER — DEXAMETHASONE SODIUM PHOSPHATE 4 MG/ML IJ SOLN
INTRAMUSCULAR | Status: DC | PRN
  Administered 2015-09-12: 10 mg via INTRAVENOUS

## 2015-09-12 MED ORDER — OXYCODONE HCL 5 MG PO TABS: 5.0000 mg | ORAL_TABLET | Freq: Once | ORAL | Status: DC | PRN

## 2015-09-12 SURGICAL SUPPLY — 86 items
APPLIER CLIP 5 13 M/L LIGAMAX5 (MISCELLANEOUS) ×4
APPLIER CLIP 9.375 MED OPEN (MISCELLANEOUS)
APR CLP MED 9.3 20 MLT OPN (MISCELLANEOUS)
APR CLP MED LRG 5 ANG JAW (MISCELLANEOUS) ×2
BAG SPEC RTRVL 10 TROC 200 (ENDOMECHANICALS) ×2
BINDER BREAST LRG (GAUZE/BANDAGES/DRESSINGS) IMPLANT
BINDER BREAST MEDIUM (GAUZE/BANDAGES/DRESSINGS) IMPLANT
BINDER BREAST XLRG (GAUZE/BANDAGES/DRESSINGS) IMPLANT
BINDER BREAST XXLRG (GAUZE/BANDAGES/DRESSINGS) IMPLANT
BLADE CLIPPER SURG (BLADE) IMPLANT
BLADE SURG 15 STRL LF DISP TIS (BLADE) ×2 IMPLANT
BLADE SURG 15 STRL SS (BLADE) ×4
CANISTER SUC SOCK COL 7IN (MISCELLANEOUS) IMPLANT
CANISTER SUCT 1200ML W/VALVE (MISCELLANEOUS) ×4 IMPLANT
CHLORAPREP W/TINT 26ML (MISCELLANEOUS) ×4 IMPLANT
CLIP APPLIE 5 13 M/L LIGAMAX5 (MISCELLANEOUS) ×2 IMPLANT
CLIP APPLIE 9.375 MED OPEN (MISCELLANEOUS) IMPLANT
CLOSURE WOUND 1/2 X4 (GAUZE/BANDAGES/DRESSINGS) ×1
COVER BACK TABLE 60X90IN (DRAPES) ×4 IMPLANT
COVER MAYO STAND STRL (DRAPES) ×4 IMPLANT
COVER PROBE W GEL 5X96 (DRAPES) ×4 IMPLANT
DECANTER SPIKE VIAL GLASS SM (MISCELLANEOUS) IMPLANT
DEVICE DUBIN W/COMP PLATE 8390 (MISCELLANEOUS) ×4 IMPLANT
DEVICE TROCAR PUNCTURE CLOSURE (ENDOMECHANICALS) ×2 IMPLANT
DRAPE C-ARM 42X72 X-RAY (DRAPES) IMPLANT
DRAPE LAPAROSCOPIC ABDOMINAL (DRAPES) ×4 IMPLANT
DRAPE UTILITY XL STRL (DRAPES) ×4 IMPLANT
DRSG TEGADERM 4X4.75 (GAUZE/BANDAGES/DRESSINGS) IMPLANT
ELECT COATED BLADE 2.86 ST (ELECTRODE) ×4 IMPLANT
ELECT REM PT RETURN 9FT ADLT (ELECTROSURGICAL) ×4
ELECTRODE REM PT RTRN 9FT ADLT (ELECTROSURGICAL) ×2 IMPLANT
FILTER SMOKE EVAC LAPAROSHD (FILTER) IMPLANT
GLOVE BIO SURGEON STRL SZ7 (GLOVE) ×4 IMPLANT
GLOVE BIOGEL PI IND STRL 6.5 (GLOVE) IMPLANT
GLOVE BIOGEL PI IND STRL 7.5 (GLOVE) ×2 IMPLANT
GLOVE BIOGEL PI IND STRL 8 (GLOVE) IMPLANT
GLOVE BIOGEL PI INDICATOR 6.5 (GLOVE) ×4
GLOVE BIOGEL PI INDICATOR 7.5 (GLOVE) ×2
GLOVE BIOGEL PI INDICATOR 8 (GLOVE) ×2
GLOVE SURG SS PI 6.5 STRL IVOR (GLOVE) ×6 IMPLANT
GLOVE SURG SS PI 7.0 STRL IVOR (GLOVE) ×2 IMPLANT
GLOVE SURG SS PI 7.5 STRL IVOR (GLOVE) ×4 IMPLANT
GOWN STRL REUS W/ TWL LRG LVL3 (GOWN DISPOSABLE) ×6 IMPLANT
GOWN STRL REUS W/TWL 2XL LVL3 (GOWN DISPOSABLE) ×2 IMPLANT
GOWN STRL REUS W/TWL LRG LVL3 (GOWN DISPOSABLE) ×12
HEMOSTAT SNOW SURGICEL 2X4 (HEMOSTASIS) IMPLANT
ILLUMINATOR WAVEGUIDE N/F (MISCELLANEOUS) ×2 IMPLANT
KIT MARKER MARGIN INK (KITS) ×4 IMPLANT
LIGHT WAVEGUIDE WIDE FLAT (MISCELLANEOUS) IMPLANT
LIQUID BAND (GAUZE/BANDAGES/DRESSINGS) ×6 IMPLANT
NDL HYPO 25X1 1.5 SAFETY (NEEDLE) ×2 IMPLANT
NEEDLE HYPO 25X1 1.5 SAFETY (NEEDLE) ×4 IMPLANT
NS IRRIG 1000ML POUR BTL (IV SOLUTION) ×4 IMPLANT
PACK BASIN DAY SURGERY FS (CUSTOM PROCEDURE TRAY) ×4 IMPLANT
PENCIL BUTTON HOLSTER BLD 10FT (ELECTRODE) ×4 IMPLANT
POUCH RETRIEVAL ECOSAC 10 (ENDOMECHANICALS) ×2 IMPLANT
POUCH RETRIEVAL ECOSAC 10MM (ENDOMECHANICALS) ×2
SCISSORS LAP 5X35 DISP (ENDOMECHANICALS) ×6 IMPLANT
SET CHOLANGIOGRAPH 5 50 .035 (SET/KITS/TRAYS/PACK) IMPLANT
SET IRRIG TUBING LAPAROSCOPIC (IRRIGATION / IRRIGATOR) ×4 IMPLANT
SHEET MEDIUM DRAPE 40X70 STRL (DRAPES) IMPLANT
SLEEVE ENDOPATH XCEL 5M (ENDOMECHANICALS) ×8 IMPLANT
SLEEVE SCD COMPRESS KNEE MED (MISCELLANEOUS) ×4 IMPLANT
SPECIMEN JAR SMALL (MISCELLANEOUS) ×4 IMPLANT
SPONGE GAUZE 4X4 12PLY STER LF (GAUZE/BANDAGES/DRESSINGS) IMPLANT
SPONGE LAP 4X18 X RAY DECT (DISPOSABLE) ×4 IMPLANT
STRIP CLOSURE SKIN 1/2X4 (GAUZE/BANDAGES/DRESSINGS) ×3 IMPLANT
SUT MNCRL AB 4-0 PS2 18 (SUTURE) ×4 IMPLANT
SUT MON AB 5-0 PS2 18 (SUTURE) IMPLANT
SUT SILK 2 0 SH (SUTURE) IMPLANT
SUT VIC AB 2-0 SH 27 (SUTURE) ×4
SUT VIC AB 2-0 SH 27XBRD (SUTURE) ×2 IMPLANT
SUT VIC AB 3-0 SH 27 (SUTURE) ×4
SUT VIC AB 3-0 SH 27X BRD (SUTURE) ×2 IMPLANT
SUT VIC AB 5-0 PS2 18 (SUTURE) IMPLANT
SUT VICRYL 0 UR6 27IN ABS (SUTURE) IMPLANT
SYR CONTROL 10ML LL (SYRINGE) ×4 IMPLANT
TOWEL OR 17X24 6PK STRL BLUE (TOWEL DISPOSABLE) ×8 IMPLANT
TOWEL OR NON WOVEN STRL DISP B (DISPOSABLE) ×4 IMPLANT
TRAY LAPAROSCOPIC (CUSTOM PROCEDURE TRAY) ×4 IMPLANT
TROCAR XCEL BLUNT TIP 100MML (ENDOMECHANICALS) ×4 IMPLANT
TROCAR XCEL NON-BLD 5MMX100MML (ENDOMECHANICALS) ×4 IMPLANT
TUBE CONNECTING 20'X1/4 (TUBING) ×1
TUBE CONNECTING 20X1/4 (TUBING) ×3 IMPLANT
TUBING INSUFFLATION (TUBING) ×4 IMPLANT
YANKAUER SUCT BULB TIP NO VENT (SUCTIONS) IMPLANT

## 2015-09-12 NOTE — H&P (Signed)
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 she had another mass noted about 1 cm that appeared to be papilloma. [place] of axilla was negative. she underwent core biopsy of this lesion and it is a papilloma. she has no prior breast history and no breast complaints otherwise.  She also since last year has been having ruq pain that radiates to her back that is made worse with eating. she has some nausea. this persists. it is better with diet changes but not gone. she has undergone thorough evaluation with endoscopies which are negative, abd [place] that shows a 3 mm nonmobile echogenic focus on the gb wall that is likely a polyp, nl hida scan although she states today when she was injected with cck she had a moderate reproduction of her symptoms which lasted a good portion of the day.    Other Problems Marjean Donna, CMA; 08/22/2015 1:43 PM) Cholelithiasis Gastroesophageal Reflux Disease Lump In Breast  Past Surgical History Marjean Donna, CMA; 08/22/2015 1:43 PM) Breast Biopsy Right.  Diagnostic Studies History Marjean Donna, CMA; 08/22/2015 1:43 PM) Colonoscopy within last year Mammogram 1-3 years ago Pap Smear 1-5 years ago  Allergies Marjean Donna, CMA; 08/22/2015 1:44 PM) Aciphex *ULCER DRUGS* Latex Exam Gloves *MEDICAL DEVICES AND SUPPLIES*  Medication History Davy Pique Bynum, CMA; 08/22/2015 1:44 PM) Vitamin B Complex (Oral) Active. Vitamin D (Cholecalciferol) (1000UNIT Capsule, Oral) Active. Probiotic & Acidophilus Ex St (Oral) Active. Medications Reconciled  Social History Marjean Donna, CMA; 08/22/2015 1:43 PM) Caffeine use Carbonated beverages, Coffee, Tea. No alcohol use No drug use Tobacco use Never smoker.  Family History Marjean Donna, CMA; 08/22/2015 1:43 PM) Breast Cancer Family Members In General. Cerebrovascular Accident Family Members In General. Cervical Cancer Family Members In [place] Members In General. Depression Mother. Diabetes Mellitus Family Members In General, Mother. Heart Disease Family Members In General. Hypertension Family Members In General, Mother. Kidney Disease Family Members In General. Prostate Cancer Family Members In General.  Pregnancy / Birth History Marjean Donna, CMA; 08/22/2015 1:43 PM) Age at menarche 44 years. Gravida 2 Irregular periods Maternal age 17-20 Para 2  Review of Systems ([place]; 08/22/2015 1:43 PM) General Not Present- Appetite Loss, Chills, Fatigue, Fever, Night Sweats, Weight Gain and Weight Loss. Skin Not Present- Change in Wart/Mole, Dryness, Hives, Jaundice, New Lesions, Non-Healing Wounds, Rash and Ulcer. HEENT Not Present- Earache, Hearing Loss, Hoarseness, Nose Bleed, Oral Ulcers, Ringing in the Ears, Seasonal Allergies, Sinus Pain, Sore Throat, Visual Disturbances, Wears glasses/contact lenses and Yellow Eyes. Respiratory Not Present- Bloody sputum, Chronic Cough, Difficulty Breathing, Snoring and Wheezing. Breast Present- Breast Mass. Not Present- Breast Pain, Nipple Discharge and Skin Changes. Cardiovascular Not Present- Chest Pain, Difficulty Breathing Lying Down, Leg Cramps, Palpitations, Rapid Heart Rate, Shortness of Breath and Swelling of Extremities. Gastrointestinal Present- Abdominal Pain, Gets full quickly at meals and Nausea. Not Present- Bloating, Bloody Stool, Change in Bowel Habits, Chronic diarrhea, Constipation, Difficulty Swallowing, Excessive gas, Hemorrhoids, Indigestion, Rectal Pain and Vomiting. Female Genitourinary Not Present- Frequency, Nocturia, Painful Urination, Pelvic Pain and Urgency. Musculoskeletal Not Present- Back Pain, Joint Pain, Joint Stiffness, Muscle Pain, Muscle Weakness and Swelling of Extremities. Neurological Not Present- Decreased Memory, Fainting, Headaches, Numbness, Seizures, Tingling, Tremor, Trouble walking and Weakness. Psychiatric Not Present-  Anxiety, Bipolar, Change in Sleep Pattern, Depression, Fearful and Frequent crying. Endocrine Not Present- Cold Intolerance, Excessive Hunger, Hair Changes, Heat Intolerance, Hot flashes and New  Diabetes. Hematology Not Present- Easy Bruising, Excessive bleeding, Gland problems, HIV and Persistent Infections.  Vitals (Sonya Bynum CMA; 08/22/2015 1:43 PM) 08/22/2015 1:43 PM Weight: 132 lb Height: 63in Body Surface Area: 1.62 m Body Mass Index: 23.38 kg/m  Temp.: 97.3F(Temporal)  Pulse: 73 (Regular)  BP: 120/70 (Sitting, Left Arm, Standard) Physical Exam Rolm Bookbinder MD; 08/22/2015 11:41 PM) General Mental Status-Alert. Orientation-Oriented X3. Chest and Lung Exam Chest and lung exam reveals -on auscultation, normal breath sounds, no adventitious sounds and normal vocal resonance. Breast Nipples-No Discharge. Breast Lump-No Palpable Breast Mass. Cardiovascular Cardiovascular examination reveals -normal heart sounds, regular rate and rhythm with no murmurs. Abdomen Note: mild ruq tenderness to deep palpation Lymphatic Head & Neck General Head & Neck Lymphatics: Bilateral - Description - Normal. Axillary General Axillary Region: Bilateral - Description - Normal. Note: no [place] adenopathy   Assessment & Plan Rolm Bookbinder MD; 08/23/2015 [id number] AM) BILIARY COLIC ([id number]) Story: laparoscopic cholecystectomy this certainly sounds like gallbladder disease but studies mostly negative. she has no other source I can identify either. I think reasonable to consider lap chole with understanding that this may not cure pain. I discussed the procedure in detail. The patient was given Neurosurgeon. We discussed the risks and benefits of a laparoscopic cholecystectomy and possible cholangiogram including, but not limited to bleeding, infection, injury to surrounding structures such as the intestine or liver, bile leak, retained gallstones, need to convert to an  open procedure, prolonged diarrhea, blood clots such as DVT, common bile duct injury, anesthesia risks, and possible need for additional procedures. The likelihood of improvement in symptoms and return to the patient's normal status is good. We discussed the typical post-operative recovery course.

## 2015-09-12 NOTE — Transfer of Care (Signed)
Immediate Anesthesia Transfer of Care Note  Patient: Anna [name]  Procedure(s) Performed: Procedure(s): LAPAROSCOPIC CHOLECYSTECTOMY (N/A) RADIOACTIVE SEED GUIDED EXCISIONAL RIGHT BREAST BIOPSY (Right)  Patient Location: PACU  Anesthesia Type:General  Level of Consciousness: awake, alert , oriented and patient cooperative  Airway & Oxygen Therapy: Patient Spontanous Breathing and Patient connected to face mask oxygen  Post-op Assessment: Report given to RN and Post -op Vital signs reviewed and stable  Post vital signs: Reviewed and stable  Last Vitals:  Filed Vitals:   09/12/15 0629  BP: 111/66  Pulse: 77  Temp: 36.8 C  Resp: 18    Last Pain: There were no vitals filed for this visit.    Patients Stated Pain Goal: 3 ([id number])  Complications: No apparent anesthesia complications

## 2015-09-12 NOTE — Anesthesia Postprocedure Evaluation (Signed)
Anesthesia Post Note  Patient: Anna [name]  Procedure(s) Performed: Procedure(s) (LRB): LAPAROSCOPIC CHOLECYSTECTOMY (N/A) RADIOACTIVE SEED GUIDED EXCISIONAL RIGHT BREAST BIOPSY (Right)  Patient location during evaluation: PACU Anesthesia Type: General Level of consciousness: awake and alert Pain management: pain level controlled Vital Signs Assessment: post-procedure vital signs reviewed and stable Respiratory status: spontaneous breathing, nonlabored ventilation, respiratory function stable and patient connected to nasal cannula oxygen Cardiovascular status: blood pressure returned to baseline and stable Postop Assessment: no signs of nausea or vomiting Anesthetic complications: no    Last Vitals:  Filed Vitals:   09/12/15 0908 09/12/15 0915  BP: 115/87 113/86  Pulse: 71 73  Temp: 36.4 C   Resp: 16 11    Last Pain:  Filed Vitals:   09/12/15 0919  PainSc: 4                  Tyasia Packard S

## 2015-09-12 NOTE — Interval H&P Note (Signed)
History and Physical Interval Note:  09/12/2015 7:10 AM  Anna [name]  has presented today for surgery, with the diagnosis of Biliary colic, right breast mass   The various methods of treatment have been discussed with the patient and family. After consideration of risks, benefits and other options for treatment, the patient has consented to  Procedure(s): LAPAROSCOPIC CHOLECYSTECTOMY (N/A) RADIOACTIVE SEED GUIDED EXCISIONAL RIGHT BREAST BIOPSY (Right) as a surgical intervention .  The patient's history has been reviewed, patient examined, no change in status, stable for surgery.  I have reviewed the patient's chart and labs.  Questions were answered to the patient's satisfaction.     Amelia Macken

## 2015-09-12 NOTE — Discharge Instructions (Signed)
[facility] °Office Phone Number [phone] ° °POST OP INSTRUCTIONS ° °Always review your discharge instruction sheet given to you by the facility where your surgery was performed. ° °IF YOU HAVE DISABILITY OR FAMILY LEAVE FORMS, YOU MUST BRING THEM TO THE OFFICE FOR PROCESSING.  DO NOT GIVE THEM TO YOUR DOCTOR. ° °1. A prescription for pain medication may be given to you upon discharge.  Take your pain medication as prescribed, if needed.  If narcotic pain medicine is not needed, then you may take acetaminophen (Tylenol), naprosyn (Alleve) or ibuprofen (Advil) as needed. °2. Take your usually prescribed medications unless otherwise directed °3. If you need a refill on your pain medication, please contact your pharmacy.  They will contact our office to request authorization.  Prescriptions will not be filled after 5pm or on week-ends. °4. You should eat very light the first 24 hours after surgery, such as soup, crackers, pudding, etc.  Resume your normal diet the day after surgery. °5. Most patients will experience some swelling and bruising in the breast.  Ice packs and a good support bra will help.  Wear the breast binder provided or a sports bra for 72 hours day and night.  After that wear a sports bra during the day until you return to the office. Swelling and bruising can take several days to resolve.  °6. It is common to experience some constipation if taking pain medication after surgery.  Increasing fluid intake and taking a stool softener will usually help or prevent this problem from occurring.  A mild laxative (Milk of Magnesia or Miralax) should be taken according to package directions if there are no bowel movements after 48 hours. °7. Unless discharge instructions indicate otherwise, you may remove your bandages 48 hours after surgery and you may shower at that time.  You may have steri-strips (small skin tapes) in place directly over the incision.  These strips should be left on the  skin for 7-10 days and will come off on their own.  If your surgeon used skin glue on the incision, you may shower in 24 hours.  The glue will flake off over the next 2-3 weeks.  Any sutures or staples will be removed at the office during your follow-up visit. °8. ACTIVITIES:  You may resume regular daily activities (gradually increasing) beginning the next day.  Wearing a good support bra or sports bra minimizes pain and swelling.  You may have sexual intercourse when it is comfortable. °a. You may drive when you no longer are taking prescription pain medication, you can comfortably wear a seatbelt, and you can safely maneuver your car and apply brakes. °b. RETURN TO WORK:  ______________________________________________________________________________________ °9. You should see your doctor in the office for a follow-up appointment approximately two weeks after your surgery.  Your doctor’s nurse will typically make your follow-up appointment when she calls you with your pathology report.  Expect your pathology report 3-4 business days after your surgery.  You may call to check if you do not hear from us after three days. °10. OTHER INSTRUCTIONS: _______________________________________________________________________________________________ _____________________________________________________________________________________________________________________________________ °_____________________________________________________________________________________________________________________________________ °_____________________________________________________________________________________________________________________________________ ° °WHEN TO CALL DR WAKEFIELD: °1. Fever over 101.0 °2. Nausea and/or vomiting. °3. Extreme swelling or bruising. °4. Continued bleeding from incision. °5. Increased pain, redness, or drainage from the incision. ° °The clinic staff is available to answer your questions during regular  business hours.  Please don’t hesitate to call and ask to speak to one of the nurses for clinical concerns.  If   you have a medical emergency, go to the nearest emergency room or call 911.  A surgeon from [facility] is always on call at the hospital. ° °For further questions, please visit [email or web address] ° ° ° °Post Anesthesia Home Care Instructions ° °Activity: °Get plenty of rest for the remainder of the day. A responsible adult should stay with you for 24 hours following the procedure.  °For the next 24 hours, DO NOT: °-Drive a car °-Operate machinery °-Drink alcoholic beverages °-Take any medication unless instructed by your physician °-Make any legal decisions or sign important papers. ° °Meals: °Start with liquid foods such as gelatin or soup. Progress to regular foods as tolerated. Avoid greasy, spicy, heavy foods. If nausea and/or vomiting occur, drink only clear liquids until the nausea and/or vomiting subsides. Call your physician if vomiting continues. ° °Special Instructions/Symptoms: °Your throat may feel dry or sore from the anesthesia or the breathing tube placed in your throat during surgery. If this causes discomfort, gargle with warm salt water. The discomfort should disappear within 24 hours. ° °If you had a scopolamine patch placed behind your ear for the management of post- operative nausea and/or vomiting: ° °1. The medication in the patch is effective for 72 hours, after which it should be removed.  Wrap patch in a tissue and discard in the trash. Wash hands thoroughly with soap and water. °2. You may remove the patch earlier than 72 hours if you experience unpleasant side effects which may include dry mouth, dizziness or visual disturbances. °3. Avoid touching the patch. Wash your hands with soap and water after contact with the patch. °  ° °

## 2015-09-12 NOTE — Anesthesia Preprocedure Evaluation (Addendum)
Anesthesia Evaluation  Patient identified by MRN, date of birth, ID band Patient awake    Reviewed: Allergy & Precautions, NPO status , Patient's Chart, lab work & pertinent test results  Airway Mallampati: II  TM Distance: >3 FB Neck ROM: Full    Dental no notable dental hx. (+) Teeth Intact, Dental Advisory Given   Pulmonary neg pulmonary ROS,    Pulmonary exam normal breath sounds clear to auscultation       Cardiovascular Exercise Tolerance: Good negative cardio ROS Normal cardiovascular exam Rhythm:Regular Rate:Normal     Neuro/Psych  Headaches, negative neurological ROS  negative psych ROS   GI/Hepatic Neg liver ROS, GERD  Medicated,  Endo/Other  negative endocrine ROS  Renal/GU negative Renal ROS  negative genitourinary   Musculoskeletal negative musculoskeletal ROS (+)   Abdominal   Peds negative pediatric ROS (+)  Hematology negative hematology ROS (+) anemia ,   Anesthesia Other Findings   Reproductive/Obstetrics negative OB ROS                           Anesthesia Physical Anesthesia Plan  ASA: II  Anesthesia Plan: General   Post-op Pain Management:    Induction: Intravenous  Airway Management Planned: Oral ETT  Additional Equipment:   Intra-op Plan:   Post-operative Plan: Extubation in OR  Informed Consent: I have reviewed the patients History and Physical, chart, labs and discussed the procedure including the risks, benefits and alternatives for the proposed anesthesia with the patient or authorized representative who has indicated his/her understanding and acceptance.   Dental advisory given  Plan Discussed with: CRNA and Surgeon  Anesthesia Plan Comments:         Anesthesia Quick Evaluation

## 2015-09-12 NOTE — Anesthesia Procedure Notes (Signed)
Procedure Name: Intubation Date/Time: 09/12/2015 7:40 AM Performed by: Wanita Chamberlain Pre-anesthesia Checklist: Patient identified, Timeout performed, Emergency Drugs available, Suction available and Patient being monitored Patient Re-evaluated:Patient Re-evaluated prior to inductionOxygen Delivery Method: Circle system utilized Preoxygenation: Pre-oxygenation with 100% oxygen Intubation Type: IV induction Ventilation: Mask ventilation without difficulty Grade View: Grade I Tube type: Oral Tube size: 7.0 mm Number of attempts: 1 Airway Equipment and Method: Stylet and LTA kit utilized Placement Confirmation: positive ETCO2,  ETT inserted through vocal cords under direct vision and breath sounds checked- equal and bilateral Secured at: 21 cm Tube secured with: Tape Dental Injury: Teeth and Oropharynx as per pre-operative assessment

## 2015-09-12 NOTE — Op Note (Addendum)
Preoperative diagnosis: Right breast mm abnormality, core biopsy with papilloma, biliary colic Postoperative diagnosis: same as above Procedure:Right breast seed guided excisional biopsy, laparoscopic cholecystectomy Surgeon: Dr Serita Grammes Anesthesia: general EBL: minimal Drains none Complications none Specimen right breast marked with paint. gb and contents Sponge and needle count was correct to completion disposition to recovery stable  Indications: This is a 64 yof who has mammographic abnormality with an intraductal papilloma. She is referred for evaluation. We discussed options and elected to perform excisional biopsy. She had a radioactive seed placed prior to beginning. I had these films in the OR. She also has symptoms of biliary colic and we discussed laparoscopic cholecystectomy.   Procedure: After informed consent was obtained the patient was taken to the operating room. She was given antibiotics. Sequential compression devices were on her legs. She was then placed under general anesthesia without complication. She was prepped and draped in the standard sterile surgical fashion. A surgical timeout was then performed. I had her mammograms available in the or.  I infiltrated 1% lidocaine and made a periareolar incision after locating the seed. I then removed the seed and the surrounding tissue. I confirmed removal of the clip and the seed with mammography. Hemostasis was observed. I painted the specimen. I then closed this with 2-0 vicryl, 3-0 Vicryl, and 5-0 Monocryl. Glue was placed over the wound.   I first infiltrated marcaine below the umbilicus. I grasped the fascia and incised it.she had a small umbilical hernia that was incisional related to prior tubal ligation.  I then entered the peritoneum bluntly. There was no entry injury. I placed a 0 vicryl pursestring. I then inserted a hasson trocar and insufflated to 15 mm Hg pressure. Iretracted the gb cephalad and  lateral. I then obtained the critical view of safety. I was able to identify the duct and place three clips proximally and one distally. The duct was divided. The duct was viable and the clips traversed the duct. I then treated the cystic artery the same.   I then removed the gallbladder from the liver bed and placed it in a bag. I removed the gallbladder from the umbilical incision. I then obtained hemostasis and irrigated.  I then removed the infraumbilical trocar and closed with 0 vicryl and the endoclose device with another 2 0vicryl sutures  I then desufflated the abdomen and removed all my remaining trocars. I then closed these with 4-0 Monocryl and Dermabond. Shetolerated this well was extubated and transferred to the recovery room in stable condition

## 2015-09-13 ENCOUNTER — Encounter (HOSPITAL_BASED_OUTPATIENT_CLINIC_OR_DEPARTMENT_OTHER): Payer: Self-pay | Admitting: General Surgery

## 2015-10-18 ENCOUNTER — Other Ambulatory Visit: Payer: Self-pay | Admitting: Family Medicine

## 2015-10-18 ENCOUNTER — Other Ambulatory Visit (INDEPENDENT_AMBULATORY_CARE_PROVIDER_SITE_OTHER): Payer: 59

## 2015-10-18 DIAGNOSIS — R7989 Other specified abnormal findings of blood chemistry: Secondary | ICD-10-CM

## 2015-10-18 LAB — CBC WITH DIFFERENTIAL/PLATELET
BASOS PCT: 0.6 % (ref 0.0–3.0)
Basophils Absolute: 0 10*3/uL (ref 0.0–0.1)
EOS PCT: 5.9 % — AB (ref 0.0–5.0)
Eosinophils Absolute: 0.2 10*3/uL (ref 0.0–0.7)
HCT: 36.2 % (ref 36.0–46.0)
Hemoglobin: 12.1 g/dL (ref 12.0–15.0)
LYMPHS PCT: 40.6 % (ref 12.0–46.0)
Lymphs Abs: 1.5 10*3/uL (ref 0.7–4.0)
MCHC: 33.5 g/dL (ref 30.0–36.0)
MCV: 87.4 fl (ref 78.0–100.0)
MONOS PCT: 7.2 % (ref 3.0–12.0)
Monocytes Absolute: 0.3 10*3/uL (ref 0.1–1.0)
NEUTROS ABS: 1.7 10*3/uL (ref 1.4–7.7)
NEUTROS PCT: 45.7 % (ref 43.0–77.0)
PLATELETS: 196 10*3/uL (ref 150.0–400.0)
RBC: 4.14 Mil/uL (ref 3.87–5.11)
RDW: 13.8 % (ref 11.5–15.5)
WBC: 3.8 10*3/uL — ABNORMAL LOW (ref 4.0–10.5)

## 2015-10-25 ENCOUNTER — Encounter: Payer: Self-pay | Admitting: Family Medicine

## 2015-10-25 NOTE — Progress Notes (Unsigned)
Patient(co-worker) request notation be made on her chart that her labs have been reviewed. Copy made for patient.

## 2015-12-11 DIAGNOSIS — Z6824 Body mass index (BMI) 24.0-24.9, adult: Secondary | ICD-10-CM | POA: Diagnosis not present

## 2015-12-11 DIAGNOSIS — B009 Herpesviral infection, unspecified: Secondary | ICD-10-CM | POA: Diagnosis not present

## 2015-12-11 DIAGNOSIS — Z01419 Encounter for gynecological examination (general) (routine) without abnormal findings: Secondary | ICD-10-CM | POA: Diagnosis not present

## 2015-12-11 DIAGNOSIS — Z124 Encounter for screening for malignant neoplasm of cervix: Secondary | ICD-10-CM | POA: Diagnosis not present

## 2016-01-21 ENCOUNTER — Other Ambulatory Visit: Payer: Self-pay | Admitting: Physician Assistant

## 2016-01-21 ENCOUNTER — Other Ambulatory Visit (INDEPENDENT_AMBULATORY_CARE_PROVIDER_SITE_OTHER): Payer: 59

## 2016-01-21 DIAGNOSIS — R739 Hyperglycemia, unspecified: Secondary | ICD-10-CM

## 2016-01-21 LAB — HEMOGLOBIN A1C: Hgb A1c MFr Bld: 5.6 % (ref 4.6–6.5)

## 2016-01-27 ENCOUNTER — Encounter: Payer: Self-pay | Admitting: Physician Assistant

## 2016-01-27 ENCOUNTER — Ambulatory Visit (INDEPENDENT_AMBULATORY_CARE_PROVIDER_SITE_OTHER): Payer: 59 | Admitting: Physician Assistant

## 2016-01-27 VITALS — BP 96/70 | HR 69 | Temp 98.3°F | Resp 16 | Ht 63.0 in | Wt 135.0 lb

## 2016-01-27 DIAGNOSIS — J019 Acute sinusitis, unspecified: Secondary | ICD-10-CM

## 2016-01-27 DIAGNOSIS — B9689 Other specified bacterial agents as the cause of diseases classified elsewhere: Secondary | ICD-10-CM

## 2016-01-27 MED ORDER — DOXYCYCLINE HYCLATE 100 MG PO CAPS: 100.0000 mg | ORAL_CAPSULE | Freq: Two times a day (BID) | ORAL | 0 refills | Status: DC

## 2016-01-27 MED ORDER — METHYLPREDNISOLONE ACETATE 80 MG/ML IJ SUSP
80.0000 mg | Freq: Once | INTRAMUSCULAR | Status: AC
  Administered 2016-01-27: 80 mg via INTRAMUSCULAR

## 2016-01-27 MED ORDER — CYANOCOBALAMIN 1000 MCG/ML IJ SOLN: INTRAMUSCULAR | 1 refills | Status: DC

## 2016-01-27 NOTE — Progress Notes (Signed)
Patient presents to clinic today c/o 4 weeks of sinus pressure with congestion, worse with allergen exposure at home. Notes 1 day of sinus pain with left-sided maxillary tenderness and left ear pain. Denies change in hearing, drainage from ear, tinnitus. Denies fever, chills. Has been taking allergy medication with only temporary relief in symptoms.   Past Medical History:  Diagnosis Date  . Allergy   . Anemia   . Blood transfusion without reported diagnosis    at birth  . GERD (gastroesophageal reflux disease)   . IBS (irritable bowel syndrome)   . Migraine   . PVC (premature ventricular contraction)     Current Outpatient Prescriptions on File Prior to Visit  Medication Sig Dispense Refill  . levocetirizine (XYZAL) 5 MG tablet Take 1 tablet (5 mg total) by mouth every evening. 90 tablet 3  . Probiotic Product (ALIGN) 4 MG CAPS Take 1 capsule by mouth every other day.    . valACYclovir (VALTREX) 1000 MG tablet Take 1 tablet (1,000 mg total) by mouth daily. (Patient taking differently: Take 1,000 mg by mouth daily as needed. ) 90 tablet 3   No current facility-administered medications on file prior to visit.     Allergies  Allergen Reactions  . Aciphex [Rabeprazole Sodium]   . Latex     Family History  Problem Relation Age of Onset  . Arthritis Maternal Grandmother   . Colon cancer Maternal Grandmother   . Hypertension Maternal Grandmother   . Diabetes Maternal Grandmother   . Arthritis Maternal Uncle   . Heart disease Maternal Uncle   . Ovarian cancer Maternal Aunt   . Prostate cancer Maternal Uncle   . Stroke Maternal Grandfather   . Kidney disease Maternal Grandfather   . Heart disease Maternal Grandfather   . Kidney failure Maternal Grandfather   . Diabetes Maternal Grandfather   . Diabetes Paternal Grandmother   . Bone cancer Paternal Grandmother   . Breast cancer Paternal Grandmother   . Prostate cancer Maternal Uncle   . Colon cancer Maternal Uncle   .  Esophageal cancer Neg Hx   . Rectal cancer Neg Hx   . Stomach cancer Neg Hx     Social History   Social History  . Marital status: Divorced    Spouse name: N/A  . Number of children: 2  . Years of education: N/A   Social History Main Topics  . Smoking status: Never Smoker  . Smokeless tobacco: Never Used  . Alcohol use No  . Drug use: No  . Sexual activity: Yes    Birth control/ protection: Surgical   Other Topics Concern  . None   Social History Narrative  . None    Review of Systems - See HPI.  All other ROS are negative.  BP 96/70 (BP Location: Left Arm, Patient Position: Sitting, Cuff Size: Normal)   Pulse 69   Temp 98.3 F (36.8 C) (Oral)   Resp 16   Ht 5\' 3"  (1.6 m)   Wt 135 lb (61.2 kg)   SpO2 98%   BMI 23.91 kg/m   Physical Exam  Constitutional: She is well-developed, well-nourished, and in no distress.  HENT:  Head: Normocephalic and atraumatic.  Right Ear: Tympanic membrane normal.  Left Ear: Tympanic membrane is retracted. A middle ear effusion is present.  Nose: Mucosal edema present. Left sinus exhibits maxillary sinus tenderness.  Mouth/Throat: Uvula is midline, oropharynx is clear and moist and mucous membranes are normal.  Eyes: Conjunctivae  are normal.  Neck: Neck supple.  Cardiovascular: Normal rate, regular rhythm, normal heart sounds and intact distal pulses.   Pulmonary/Chest: Effort normal and breath sounds normal. No respiratory distress. She has no wheezes. She has no rales. She exhibits no tenderness.  Lymphadenopathy:    She has no cervical adenopathy.  Vitals reviewed.   Recent Results (from the past 2160 hour(s))  Hemoglobin A1c     Status: None   Collection Time: 01/21/16  8:13 AM  Result Value Ref Range   Hgb A1c MFr Bld 5.6 4.6 - 6.5 %    Comment: Glycemic Control Guidelines for People with Diabetes:Non Diabetic:  <6%Goal of Therapy: <7%Additional Action Suggested:  >8%     Assessment/Plan: 1. Acute bacterial  sinusitis Rx doxycycline. Im Depomedrol given to help with sinus swelling and significant ear pressure 2/2 eustachian tube dysfunction.  Increase fluids.  Rest.  Saline nasal spray.  Probiotic.  Mucinex as directed.  Humidifier in bedroom.  Call or return to clinic if symptoms are not improving.  - doxycycline (VIBRAMYCIN) 100 MG capsule; Take 1 capsule (100 mg total) by mouth 2 (two) times daily.  Dispense: 20 capsule; Refill: 0 - methylPREDNISolone acetate (DEPO-MEDROL) injection 80 mg; Inject 1 mL (80 mg total) into the muscle once.   Leeanne Rio, PA-C

## 2016-01-27 NOTE — Progress Notes (Signed)
Pre visit review using our clinic review tool, if applicable. No additional management support is needed unless otherwise documented below in the visit note/SLS  

## 2016-01-27 NOTE — Patient Instructions (Signed)
Please take antibiotic as directed.  Increase fluid intake.  Use Saline nasal spray.  Take a daily multivitamin. Continue Xyzal and Flonase. The steroid injection should help with pain and swelling. Use tylenol as needed for ear pain.  Place a humidifier in the bedroom.  Please call or return clinic if symptoms are not improving.  Sinusitis Sinusitis is redness, soreness, and swelling (inflammation) of the paranasal sinuses. Paranasal sinuses are air pockets within the bones of your face (beneath the eyes, the middle of the forehead, or above the eyes). In healthy paranasal sinuses, mucus is able to drain out, and air is able to circulate through them by way of your nose. However, when your paranasal sinuses are inflamed, mucus and air can become trapped. This can allow bacteria and other germs to grow and cause infection. Sinusitis can develop quickly and last only a short time (acute) or continue over a long period (chronic). Sinusitis that lasts for more than 12 weeks is considered chronic.  CAUSES  Causes of sinusitis include:  Allergies.  Structural abnormalities, such as displacement of the cartilage that separates your nostrils (deviated septum), which can decrease the air flow through your nose and sinuses and affect sinus drainage.  Functional abnormalities, such as when the small hairs (cilia) that line your sinuses and help remove mucus do not work properly or are not present. SYMPTOMS  Symptoms of acute and chronic sinusitis are the same. The primary symptoms are pain and pressure around the affected sinuses. Other symptoms include:  Upper toothache.  Earache.  Headache.  Bad breath.  Decreased sense of smell and taste.  A cough, which worsens when you are lying flat.  Fatigue.  Fever.  Thick drainage from your nose, which often is green and may contain pus (purulent).  Swelling and warmth over the affected sinuses. DIAGNOSIS  Your caregiver will perform a physical  exam. During the exam, your caregiver may:  Look in your nose for signs of abnormal growths in your nostrils (nasal polyps).  Tap over the affected sinus to check for signs of infection.  View the inside of your sinuses (endoscopy) with a special imaging device with a light attached (endoscope), which is inserted into your sinuses. If your caregiver suspects that you have chronic sinusitis, one or more of the following tests may be recommended:  Allergy tests.  Nasal culture A sample of mucus is taken from your nose and sent to a lab and screened for bacteria.  Nasal cytology A sample of mucus is taken from your nose and examined by your caregiver to determine if your sinusitis is related to an allergy. TREATMENT  Most cases of acute sinusitis are related to a viral infection and will resolve on their own within 10 days. Sometimes medicines are prescribed to help relieve symptoms (pain medicine, decongestants, nasal steroid sprays, or saline sprays).  However, for sinusitis related to a bacterial infection, your caregiver will prescribe antibiotic medicines. These are medicines that will help kill the bacteria causing the infection.  Rarely, sinusitis is caused by a fungal infection. In theses cases, your caregiver will prescribe antifungal medicine. For some cases of chronic sinusitis, surgery is needed. Generally, these are cases in which sinusitis recurs more than 3 times per year, despite other treatments. HOME CARE INSTRUCTIONS   Drink plenty of water. Water helps thin the mucus so your sinuses can drain more easily.  Use a humidifier.  Inhale steam 3 to 4 times a day (for example, sit in the  bathroom with the shower running).  Apply a warm, moist washcloth to your face 3 to 4 times a day, or as directed by your caregiver.  Use saline nasal sprays to help moisten and clean your sinuses.  Take over-the-counter or prescription medicines for pain, discomfort, or fever only as  directed by your caregiver. SEEK IMMEDIATE MEDICAL CARE IF:  You have increasing pain or severe headaches.  You have nausea, vomiting, or drowsiness.  You have swelling around your face.  You have vision problems.  You have a stiff neck.  You have difficulty breathing. MAKE SURE YOU:   Understand these instructions.  Will watch your condition.  Will get help right away if you are not doing well or get worse. Document Released: 04/20/2005 Document Revised: 07/13/2011 Document Reviewed: 05/05/2011 [facility] Patient Information 2014 [place], [place].

## 2016-08-06 ENCOUNTER — Other Ambulatory Visit: Payer: Self-pay | Admitting: Family Medicine

## 2016-08-06 MED ORDER — CYANOCOBALAMIN 1000 MCG/ML IJ SOLN: INTRAMUSCULAR | 1 refills | Status: DC

## 2016-10-15 LAB — HM PAP SMEAR

## 2016-10-19 LAB — HM PAP SMEAR

## 2016-10-23 ENCOUNTER — Encounter: Payer: Self-pay | Admitting: General Practice

## 2017-05-13 IMAGING — MG MM DIAGNOSTIC UNILATERAL R
2 series · 2 of 2 positions shown · non-contrast
Comparison: Previous exam(s).

CLINICAL DATA: Status post ultrasound-guided core biopsy of
intraductal mass the 3 o'clock location of the right breast 2 cm
from the nipple.

EXAM:
DIAGNOSTIC RIGHT MAMMOGRAM POST ULTRASOUND BIOPSY

[R CC]
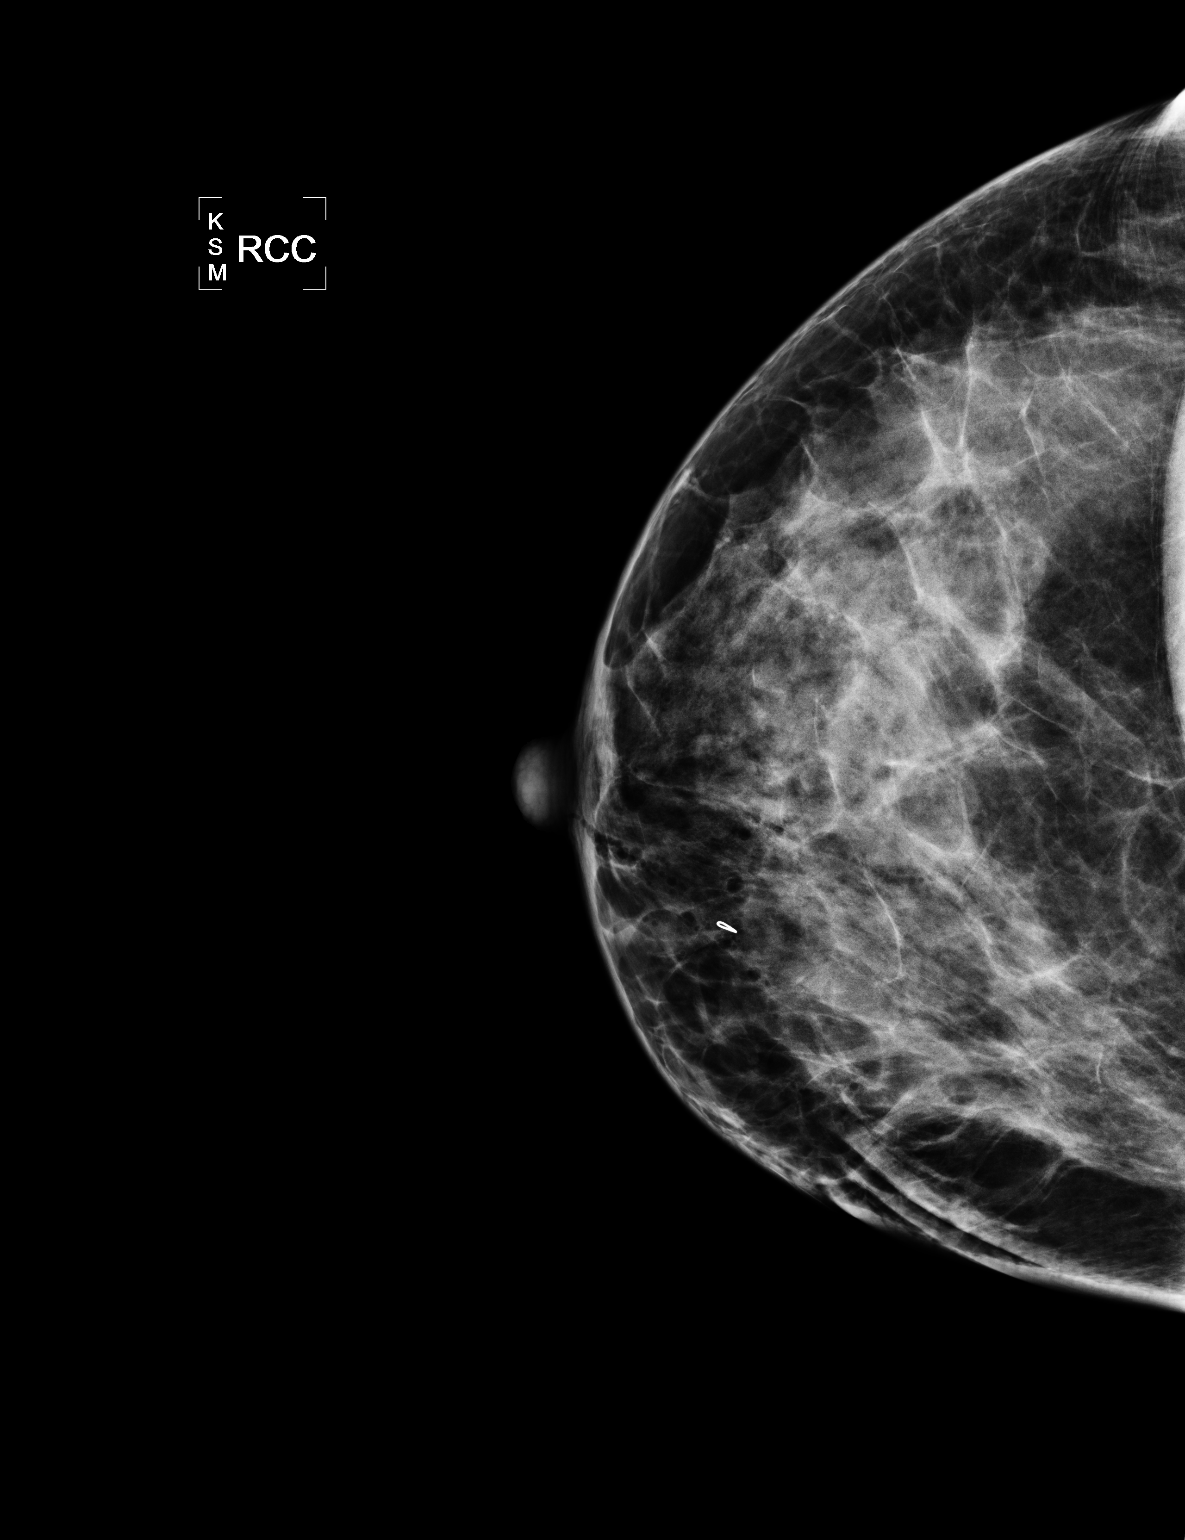

[R ML]
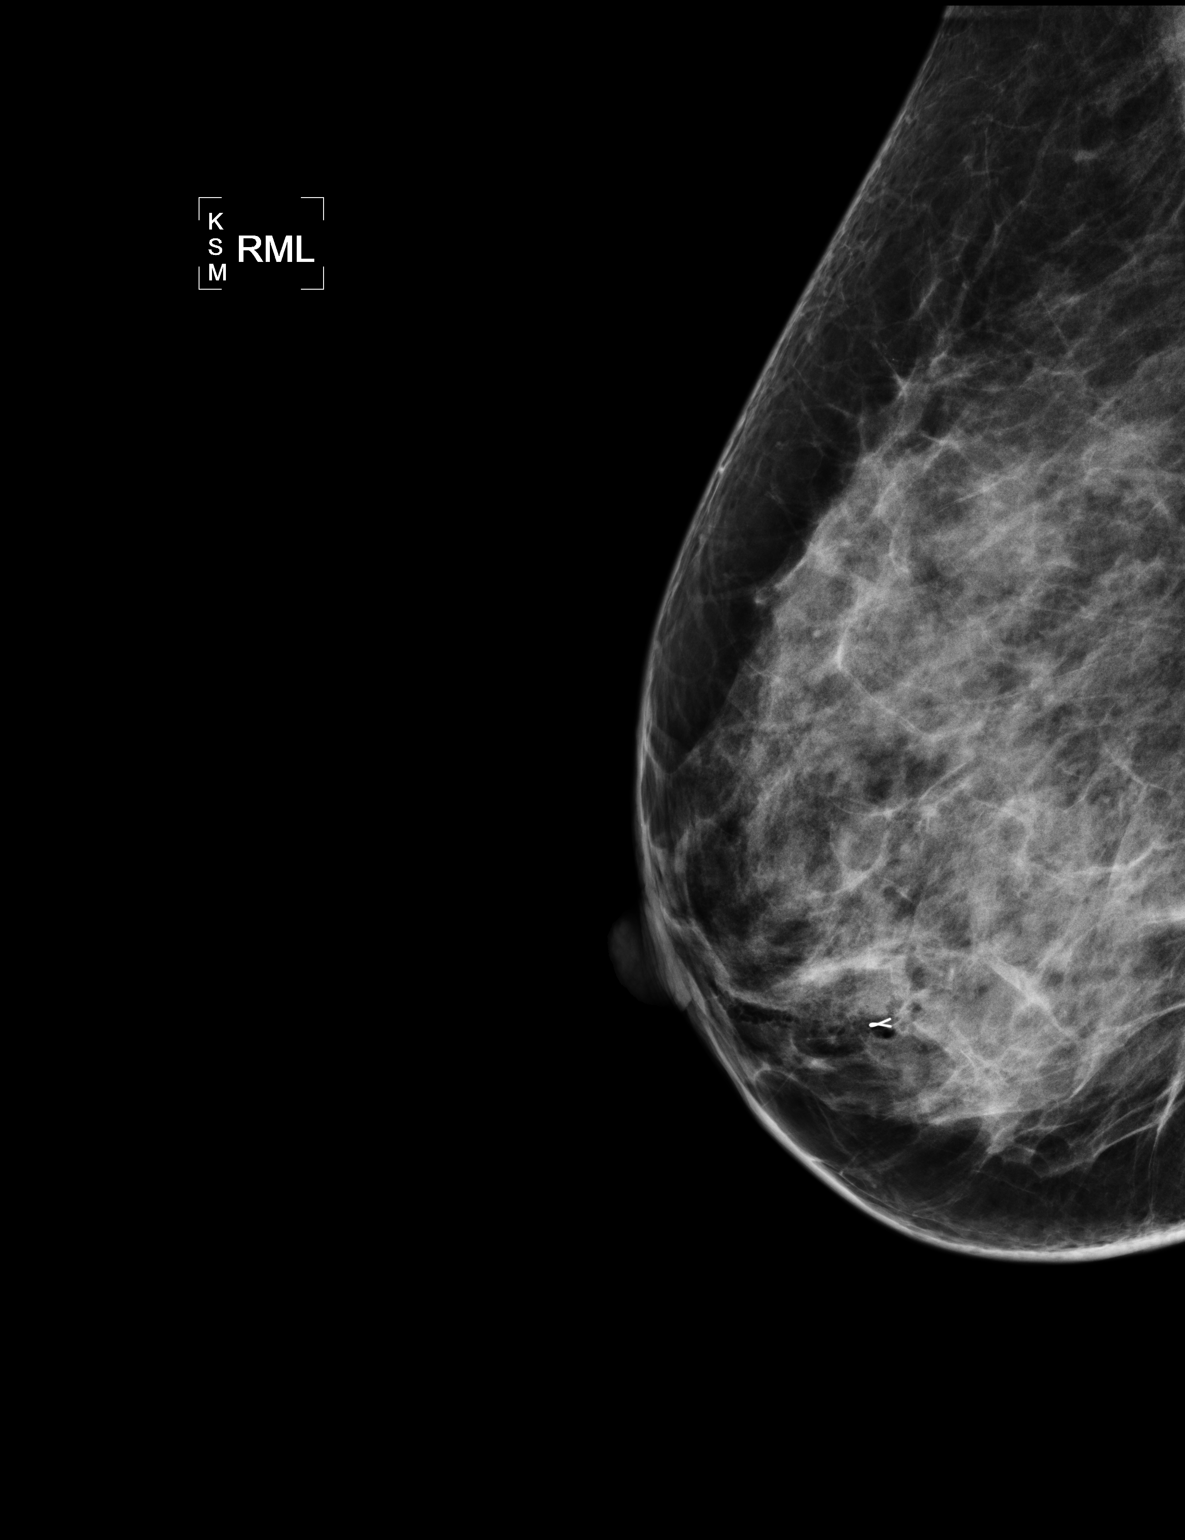

[2 of 2 positions shown; findings below may reference images not displayed]

FINDINGS: Mammographic images were obtained following ultrasound guided biopsy
of mass in the right breast 3 o'clock location. A ribbon shaped clip
is identified in the medial aspect of the right breast, in expected
location following biopsy.
IMPRESSION: Tissue marker clip is in expected location after biopsy.

Final Assessment: Post Procedure Mammograms for Marker Placement

## 2017-08-12 ENCOUNTER — Other Ambulatory Visit: Payer: Self-pay | Admitting: Obstetrics and Gynecology

## 2017-08-12 DIAGNOSIS — N644 Mastodynia: Secondary | ICD-10-CM | POA: Diagnosis not present

## 2017-08-17 ENCOUNTER — Ambulatory Visit
Admission: RE | Admit: 2017-08-17 | Discharge: 2017-08-17 | Disposition: A | Discharge status: Home / Self Care | Payer: 59 | Source: Ambulatory Visit | Attending: Obstetrics and Gynecology | Admitting: Obstetrics and Gynecology

## 2017-08-17 DIAGNOSIS — N644 Mastodynia: Secondary | ICD-10-CM

## 2017-08-17 DIAGNOSIS — R922 Inconclusive mammogram: Secondary | ICD-10-CM | POA: Diagnosis not present

## 2017-08-19 ENCOUNTER — Encounter (HOSPITAL_COMMUNITY): Payer: Self-pay | Admitting: Family Medicine

## 2017-08-19 ENCOUNTER — Ambulatory Visit (HOSPITAL_COMMUNITY)
Admission: EM | Admit: 2017-08-19 | Discharge: 2017-08-19 | Disposition: A | Discharge status: Home / Self Care | Payer: 59 | Attending: Family Medicine | Admitting: Family Medicine

## 2017-08-19 DIAGNOSIS — L42 Pityriasis rosea: Secondary | ICD-10-CM | POA: Diagnosis not present

## 2017-08-19 MED ORDER — TRIAMCINOLONE ACETONIDE 0.1 % EX CREA: 1.0000 "application " | TOPICAL_CREAM | Freq: Two times a day (BID) | CUTANEOUS | 0 refills | Status: DC

## 2017-08-19 NOTE — ED Provider Notes (Signed)
Anna [name]    CSN: [id number] Arrival date & time: 08/19/17  [place]     History   Chief Complaint Chief Complaint  Patient presents with  . Rash    HPI Anna [name] is a 37 y.o. female.   Rash on trunk that is slightly pruritic.  Patient has noted it is worse when she gets hot and sweats.  Using several OTC preparations as well as triamcinolone.  HPI  Past Medical History:  Diagnosis Date  . Allergy   . Anemia   . Blood transfusion without reported diagnosis    at birth  . GERD (gastroesophageal reflux disease)   . IBS (irritable bowel syndrome)   . Migraine   . PVC (premature ventricular contraction)     Patient Active Problem List   Diagnosis Date Noted  . Colicky RUQ abdominal pain 11/23/2014  . Pain of left calf 09/11/2013  . PVC's (premature ventricular contractions) 10/27/2011  . Irritable bowel syndrome 10/14/2011  . Headache(784.0) 10/14/2011  . Knee pain, left 09/23/2010    Past Surgical History:  Procedure Laterality Date  . BREAST EXCISIONAL BIOPSY    . CHOLECYSTECTOMY N/A 09/12/2015   Procedure: LAPAROSCOPIC CHOLECYSTECTOMY;  Surgeon: Rolm Bookbinder, MD;  Location: [place];  Service: General;  Laterality: N/A;  . ENDOMETRIAL ABLATION    . RADIOACTIVE SEED GUIDED EXCISIONAL BREAST BIOPSY Right 09/12/2015   Procedure: RADIOACTIVE SEED GUIDED EXCISIONAL RIGHT BREAST BIOPSY;  Surgeon: Rolm Bookbinder, MD;  Location: [facility];  Service: General;  Laterality: Right;  . TUBAL LIGATION  06-04-2002  . WISDOM TOOTH EXTRACTION  2010    OB History    Gravida  2   Para  2   Term  2   Preterm      AB      Living  2     SAB      TAB      Ectopic      Multiple      Live Births  2            Home Medications    Prior to Admission medications   Medication Sig Start Date End Date Taking? Authorizing Provider  cyanocobalamin (,VITAMIN B-12,) 1000 MCG/ML injection Inject 1 ml or 1000 mcg  of Vit B12 into muscle monthly for 1 year 08/06/16   Carollee Herter, Alferd Apa, DO  doxycycline (VIBRAMYCIN) 100 MG capsule Take 1 capsule (100 mg total) by mouth 2 (two) times daily. 01/27/16   Brunetta Jeans, PA-C  levocetirizine (XYZAL) 5 MG tablet Take 1 tablet (5 mg total) by mouth every evening. 02/21/15   Ann Held, DO  Probiotic Product (ALIGN) 4 MG CAPS Take 1 capsule by mouth every other day.    [provider]    Family History Family History  Problem Relation Age of Onset  . Arthritis Maternal Uncle   . Heart disease Maternal Uncle   . Arthritis Maternal Grandmother   . Colon cancer Maternal Grandmother   . Hypertension Maternal Grandmother   . Diabetes Maternal Grandmother   . Ovarian cancer Maternal Aunt   . Prostate cancer Maternal Uncle   . Stroke Maternal Grandfather   . Kidney disease Maternal Grandfather   . Heart disease Maternal Grandfather   . Kidney failure Maternal Grandfather   . Diabetes Maternal Grandfather   . Diabetes Paternal Grandmother   . Bone cancer Paternal Grandmother   . Breast cancer Paternal Grandmother   .  Prostate cancer Maternal Uncle   . Colon cancer Maternal Uncle   . Esophageal cancer Neg Hx   . Rectal cancer Neg Hx   . Stomach cancer Neg Hx     Social History Social History   Tobacco Use  . Smoking status: Never Smoker  . Smokeless tobacco: Never Used  Substance Use Topics  . Alcohol use: No  . Drug use: No     Allergies   Aciphex [rabeprazole sodium] and Latex   Review of Systems Review of Systems  Constitutional: Negative for chills and fever.  HENT: Negative for ear pain and sore throat.   Eyes: Negative for pain and visual disturbance.  Respiratory: Negative for cough and shortness of breath.   Cardiovascular: Negative for chest pain and palpitations.  Gastrointestinal: Negative for abdominal pain and vomiting.  Genitourinary: Negative for dysuria and hematuria.  Musculoskeletal: Negative for  arthralgias and back pain.  Skin: Positive for rash. Negative for color change.  Neurological: Negative for seizures and syncope.  All other systems reviewed and are negative.    Physical Exam Triage Vital Signs ED Triage Vitals  Enc Vitals Group     BP --      Pulse Rate 08/19/17 1747 66     Resp 08/19/17 1747 18     Temp --      Temp src --      SpO2 08/19/17 1747 100 %     Weight --      Height --      Head Circumference --      Peak Flow --      Pain Score 08/19/17 1746 0     Pain Loc --      Pain Edu? --      Excl. in [place]? --    No data found.  Updated Vital Signs Pulse 66   Resp 18   SpO2 100%   Visual Acuity Right Eye Distance:   Left Eye Distance:   Bilateral Distance:    Right Eye Near:   Left Eye Near:    Bilateral Near:     Physical Exam  Constitutional: She appears well-developed and well-nourished.  Skin: Rash noted.  Rash on back and chest and breast consistent with pityriasis rosea     UC Treatments / Results  Labs (all labs ordered are listed, but only abnormal results are displayed) Labs Reviewed - No data to display  EKG None Radiology No results found.  Procedures Procedures (including critical care time)  Medications Ordered in UC Medications - No data to display   Initial Impression / Assessment and Plan / UC Course  I have reviewed the triage vital signs and the nursing notes.  Pertinent labs & imaging results that were available during my care of the patient were reviewed by me and considered in my medical decision making (see chart for details).    Rash consistent with pityriasis rosea.  Explained viral etiology.  Would recommend continuation of triamcinolone as needed, UV light.  Final Clinical Impressions(s) / UC Diagnoses   Final diagnoses:  None    ED Discharge Orders    None       Controlled Substance Prescriptions [place] Controlled Substance Registry consulted? No   Wardell Honour, MD 08/19/17  [phone]

## 2017-08-19 NOTE — ED Triage Notes (Signed)
Pt here for rash to breast and back. Reports that she had a mammogram last week which was negative. sts recent trip to [place] and she bought some new bras there.

## 2017-08-23 DIAGNOSIS — Z809 Family history of malignant neoplasm, unspecified: Secondary | ICD-10-CM | POA: Insufficient documentation

## 2017-11-09 DIAGNOSIS — Z01419 Encounter for gynecological examination (general) (routine) without abnormal findings: Secondary | ICD-10-CM | POA: Diagnosis not present

## 2017-11-09 DIAGNOSIS — B009 Herpesviral infection, unspecified: Secondary | ICD-10-CM | POA: Diagnosis not present

## 2017-11-09 DIAGNOSIS — Z6827 Body mass index (BMI) 27.0-27.9, adult: Secondary | ICD-10-CM | POA: Diagnosis not present

## 2017-11-09 DIAGNOSIS — Z124 Encounter for screening for malignant neoplasm of cervix: Secondary | ICD-10-CM | POA: Diagnosis not present

## 2017-11-24 ENCOUNTER — Encounter: Payer: Self-pay | Admitting: *Deleted

## 2017-12-03 ENCOUNTER — Other Ambulatory Visit: Payer: Self-pay | Admitting: Family Medicine

## 2017-12-03 ENCOUNTER — Encounter: Payer: Self-pay | Admitting: Family Medicine

## 2017-12-03 ENCOUNTER — Ambulatory Visit (INDEPENDENT_AMBULATORY_CARE_PROVIDER_SITE_OTHER): Payer: 59 | Admitting: Family Medicine

## 2017-12-03 VITALS — BP 114/76 | HR 87 | Temp 98.2°F | Resp 16 | Ht 63.0 in | Wt 159.0 lb

## 2017-12-03 DIAGNOSIS — E538 Deficiency of other specified B group vitamins: Secondary | ICD-10-CM | POA: Diagnosis not present

## 2017-12-03 DIAGNOSIS — D509 Iron deficiency anemia, unspecified: Secondary | ICD-10-CM | POA: Diagnosis not present

## 2017-12-03 DIAGNOSIS — R5383 Other fatigue: Secondary | ICD-10-CM | POA: Diagnosis not present

## 2017-12-03 DIAGNOSIS — R232 Flushing: Secondary | ICD-10-CM

## 2017-12-03 LAB — COMPREHENSIVE METABOLIC PANEL
ALK PHOS: 58 U/L (ref 39–117)
ALT: 10 U/L (ref 0–35)
AST: 17 U/L (ref 0–37)
Albumin: 4.6 g/dL (ref 3.5–5.2)
BILIRUBIN TOTAL: 0.4 mg/dL (ref 0.2–1.2)
BUN: 13 mg/dL (ref 6–23)
CO2: 33 mEq/L — ABNORMAL HIGH (ref 19–32)
Calcium: 10.2 mg/dL (ref 8.4–10.5)
Chloride: 101 mEq/L (ref 96–112)
Creatinine, Ser: 0.86 mg/dL (ref 0.40–1.20)
GFR: 94.57 mL/min (ref >60.00)
GLUCOSE: 91 mg/dL (ref 70–99)
Potassium: 4.1 mEq/L (ref 3.5–5.1)
Sodium: 140 mEq/L (ref 135–145)
TOTAL PROTEIN: 7.3 g/dL (ref 6.0–8.3)

## 2017-12-03 LAB — CBC WITH DIFFERENTIAL/PLATELET
BASOS ABS: 0 10*3/uL (ref 0.0–0.1)
Basophils Relative: 0.5 % (ref 0.0–3.0)
Eosinophils Absolute: 0.1 10*3/uL (ref 0.0–0.7)
Eosinophils Relative: 4.8 % (ref 0.0–5.0)
HEMATOCRIT: 39.5 % (ref 36.0–46.0)
HEMOGLOBIN: 13.1 g/dL (ref 12.0–15.0)
LYMPHS ABS: 1.4 10*3/uL (ref 0.7–4.0)
LYMPHS PCT: 48.9 % — AB (ref 12.0–46.0)
MCHC: 33.3 g/dL (ref 30.0–36.0)
MCV: 88.6 fl (ref 78.0–100.0)
MONOS PCT: 6.1 % (ref 3.0–12.0)
Monocytes Absolute: 0.2 10*3/uL (ref 0.1–1.0)
NEUTROS PCT: 39.7 % — AB (ref 43.0–77.0)
Neutro Abs: 1.2 10*3/uL — ABNORMAL LOW (ref 1.4–7.7)
Platelets: 233 10*3/uL (ref 150.0–400.0)
RBC: 4.45 Mil/uL (ref 3.87–5.11)
RDW: 13.4 % (ref 11.5–15.5)
WBC: 2.9 10*3/uL — AB (ref 4.0–10.5)

## 2017-12-03 LAB — VITAMIN B12: Vitamin B-12: 331 pg/mL (ref 211–911)

## 2017-12-03 LAB — IBC PANEL
Iron: 94 ug/dL (ref 42–145)
Saturation Ratios: 27.2 % (ref 20.0–50.0)
TRANSFERRIN: 247 mg/dL (ref 212.0–360.0)

## 2017-12-03 LAB — FERRITIN: Ferritin: 127.4 ng/mL (ref 10.0–291.0)

## 2017-12-03 LAB — FOLLICLE STIMULATING HORMONE: FSH: 86.4 m[IU]/mL

## 2017-12-03 MED ORDER — CYANOCOBALAMIN 1000 MCG/ML IJ SOLN
INTRAMUSCULAR | 1 refills | Status: DC
Start: 2017-12-03 — End: 2021-02-18

## 2017-12-03 MED ORDER — CYANOCOBALAMIN 1000 MCG/ML IJ SOLN
1000.0000 ug | Freq: Once | INTRAMUSCULAR | Status: AC
  Administered 2017-12-03: 1000 ug via INTRAMUSCULAR

## 2017-12-03 MED ORDER — CYANOCOBALAMIN 1000 MCG/ML IJ SOLN: INTRAMUSCULAR | 1 refills | Status: DC

## 2017-12-03 MED FILL — CYANOCOBALAMIN 1,000 MCG/ML: 1000 | 84 days supply | Qty: 3 | Fill #0

## 2017-12-03 NOTE — Progress Notes (Signed)
Check labs.  Adjust meds prn Patient ID: Anna [name], female    DOB: [birth year]  Age: 37 y.o. MRN: [id number]    Subjective:  Subjective  HPI MIESHIA PEPITONE presents for f/u anemia, b12 def and is c/o hot flashes.  She recently saw Dr Raphael Gibney but no labs were done at that time.  No other complaints  Review of Systems  Constitutional: Positive for fatigue. Negative for chills and fever.  HENT: Negative for congestion and hearing loss.   Eyes: Negative for discharge.  Respiratory: Negative for cough and shortness of breath.   Cardiovascular: Negative for chest pain, palpitations and leg swelling.  Gastrointestinal: Negative for abdominal pain, blood in stool, constipation, diarrhea, nausea and vomiting.  Genitourinary: Negative for dysuria, frequency, hematuria and urgency.  Musculoskeletal: Negative for back pain and myalgias.  Skin: Negative for rash.  Allergic/Immunologic: Negative for environmental allergies.  Neurological: Negative for dizziness, weakness and headaches.  Hematological: Does not bruise/bleed easily.  Psychiatric/Behavioral: Negative for suicidal ideas. The patient is not nervous/anxious.     History Past Medical History:  Diagnosis Date  . Allergy   . Anemia   . Blood transfusion without reported diagnosis    at birth  . GERD (gastroesophageal reflux disease)   . IBS (irritable bowel syndrome)   . Migraine   . PVC (premature ventricular contraction)     She has a past surgical history that includes Wisdom tooth extraction (2010); Tubal ligation (06-04-2002); Endometrial ablation; Cholecystectomy (N/A, 09/12/2015); Radioactive seed guided excisional breast biopsy (Right, 09/12/2015); and Breast excisional biopsy.   Her family history includes Arthritis in her maternal grandmother and maternal uncle; Bone cancer in her paternal grandmother; Breast cancer in her paternal grandmother; Colon cancer in her maternal grandmother and maternal uncle;  Diabetes in her maternal grandfather, maternal grandmother, and paternal grandmother; Heart disease in her maternal grandfather and maternal uncle; Hypertension in her maternal grandmother; Kidney disease in her maternal grandfather; Kidney failure in her maternal grandfather; Ovarian cancer in her maternal aunt; Prostate cancer in her maternal uncle and maternal uncle; Stroke in her maternal grandfather.She reports that she has never smoked. She has never used smokeless tobacco. She reports that she does not drink alcohol or use drugs.  Current Outpatient Medications on File Prior to Visit  Medication Sig Dispense Refill  . levocetirizine (XYZAL) 5 MG tablet Take 1 tablet (5 mg total) by mouth every evening. 90 tablet 3  . Probiotic Product (ALIGN) 4 MG CAPS Take 1 capsule by mouth every other day.    Marland Kitchen UNABLE TO FIND Place under the tongue daily. Med Name: B complex liquid    . Vitamin D, Ergocalciferol, (DRISDOL) 50000 units CAPS capsule Take 50,000 Units by mouth daily.     No current facility-administered medications on file prior to visit.      Objective:  Objective  Physical Exam  Constitutional: She is oriented to person, place, and time. She appears well-developed and well-nourished.  HENT:  Head: Normocephalic and atraumatic.  Eyes: Conjunctivae and EOM are normal.  Neck: Normal range of motion. Neck supple. No JVD present. Carotid bruit is not present. No thyromegaly present.  Cardiovascular: Normal rate, regular rhythm and normal heart sounds.  No murmur heard. Pulmonary/Chest: Effort normal and breath sounds normal. No respiratory distress. She has no wheezes. She has no rales. She exhibits no tenderness.  Musculoskeletal: She exhibits no edema.  Neurological: She is alert and oriented to person, place, and time.  Psychiatric: She has a  normal mood and affect.  Nursing note and vitals reviewed.  BP 114/76 (BP Location: Right Arm, Patient Position: Sitting, Cuff Size: Large)    Pulse 87   Temp 98.2 F (36.8 C) (Oral)   Resp 16   Ht 5\' 3"  (1.6 m)   Wt 159 lb (72.1 kg)   SpO2 99%   BMI 28.17 kg/m  Wt Readings from Last 3 Encounters:  12/03/17 159 lb (72.1 kg)  01/27/16 135 lb (61.2 kg)  09/12/15 135 lb (61.2 kg)     Lab Results  Component Value Date   WBC 3.8 (L) 10/18/2015   HGB 12.1 10/18/2015   HCT 36.2 10/18/2015   PLT 196.0 10/18/2015   GLUCOSE 83 09/11/2015   CHOL 144 03/16/2014   TRIG 38.0 03/16/2014   HDL 46.40 03/16/2014   LDLCALC 90 03/16/2014   ALT 11 (L) 09/11/2015   AST 19 09/11/2015   NA 140 09/11/2015   K 3.9 09/11/2015   CL 103 09/11/2015   CREATININE 0.71 09/11/2015   BUN 7 09/11/2015   CO2 30 09/11/2015   TSH 1.30 07/15/2015   HGBA1C 5.6 01/21/2016    No results found.   Assessment & Plan:  Plan  I have discontinued Samanth P. Erhard's doxycycline, triamcinolone cream, and b complex vitamins. I am also having her maintain her levocetirizine, ALIGN, Vitamin D (Ergocalciferol), UNABLE TO FIND, and cyanocobalamin. We administered cyanocobalamin.  Meds ordered this encounter  Medications  . cyanocobalamin (,VITAMIN B-12,) 1000 MCG/ML injection    Sig: Inject 1 ml or 1000 mcg of Vit B12 into muscle monthly for 1 year    Dispense:  12 mL    Refill:  1    Please provide 3 cc syringes and 25 gauge 1 inch needs for administration. Please leave pt oral ferrous sulfate 325 mg tabs , 1 po daily, with her script. Thanks.  . cyanocobalamin ((VITAMIN B-12)) injection 1,000 mcg    Problem List Items Addressed This Visit      Unprioritized   Fatigue - Primary    Check labs Pt has not had b12 in a while -- she was taking b 12 sublingual       Relevant Orders   CBC with Differential/Platelet   Comprehensive metabolic panel   Vitamin X21   Vitamin D 1,25 dihydroxy   IBC panel   Ferritin   Thyroid Panel With TSH   Hot flashes    Check labs ? perimenopause      Relevant Orders   Estradiol   FSH   Iron deficiency  anemia    Pt is taking iron Check labs      Relevant Medications   cyanocobalamin (,VITAMIN B-12,) 1000 MCG/ML injection   cyanocobalamin ((VITAMIN B-12)) injection 1,000 mcg (Completed)   Other Relevant Orders   CBC with Differential/Platelet   Comprehensive metabolic panel   Vitamin J94   Vitamin D 1,25 dihydroxy   IBC panel   Ferritin   Thyroid Panel With TSH   Vitamin B 12 deficiency    Check labs today b12 IM given today      Relevant Medications   cyanocobalamin ((VITAMIN B-12)) injection 1,000 mcg (Completed)   Other Relevant Orders   CBC with Differential/Platelet   Comprehensive metabolic panel   Vitamin R74   Vitamin D 1,25 dihydroxy   IBC panel   Ferritin   Thyroid Panel With TSH      Follow-up: Return if symptoms worsen or fail to improve.  Rosalita Chessman  Chase, DO

## 2017-12-03 NOTE — Assessment & Plan Note (Signed)
Check labs today b12 IM given today

## 2017-12-03 NOTE — Patient Instructions (Signed)
Perimenopause Perimenopause is the time when your body begins to move into the menopause (no menstrual period for 12 straight months). It is a natural process. Perimenopause can begin 2-8 years before the menopause and usually lasts for 1 year after the menopause. During this time, your ovaries may or may not produce an egg. The ovaries vary in their production of estrogen and progesterone hormones each month. This can cause irregular menstrual periods, difficulty getting pregnant, vaginal bleeding between periods, and uncomfortable symptoms. What are the causes?  Irregular production of the ovarian hormones, estrogen and progesterone, and not ovulating every month. Other causes include:  Tumor of the pituitary gland in the brain.  Medical disease that affects the ovaries.  Radiation treatment.  Chemotherapy.  Unknown causes.  Heavy smoking and excessive alcohol intake can bring on perimenopause sooner. What are the signs or symptoms?  Hot flashes.  Night sweats.  Irregular menstrual periods.  Decreased sex drive.  Vaginal dryness.  Headaches.  Mood swings.  Depression.  Memory problems.  Irritability.  Tiredness.  Weight gain.  Trouble getting pregnant.  The beginning of losing bone cells (osteoporosis).  The beginning of hardening of the arteries (atherosclerosis). How is this diagnosed? Your health care provider will make a diagnosis by analyzing your age, menstrual history, and symptoms. He or she will do a physical exam and note any changes in your body, especially your female organs. Female hormone tests may or may not be helpful depending on the amount of female hormones you produce and when you produce them. However, other hormone tests may be helpful to rule out other problems. How is this treated? In some cases, no treatment is needed. The decision on whether treatment is necessary during the perimenopause should be made by you and your health care  provider based on how the symptoms are affecting you and your lifestyle. Various treatments are available, such as:  Treating individual symptoms with a specific medicine for that symptom.  Herbal medicines that can help specific symptoms.  Counseling.  Group therapy. Follow these instructions at home:  Keep track of your menstrual periods (when they occur, how heavy they are, how long between periods, and how long they last) as well as your symptoms and when they started.  Only take over-the-counter or prescription medicines as directed by your health care provider.  Sleep and rest.  Exercise.  Eat a diet that contains calcium (good for your bones) and soy (acts like the estrogen hormone).  Do not smoke.  Avoid alcoholic beverages.  Take vitamin supplements as recommended by your health care provider. Taking vitamin E may help in certain cases.  Take calcium and vitamin D supplements to help prevent bone loss.  Group therapy is sometimes helpful.  Acupuncture may help in some cases. Contact a health care provider if:  You have questions about any symptoms you are having.  You need a referral to a specialist (gynecologist, psychiatrist, or psychologist). Get help right away if:  You have vaginal bleeding.  Your period lasts longer than 8 days.  Your periods are recurring sooner than 21 days.  You have bleeding after intercourse.  You have severe depression.  You have pain when you urinate.  You have severe headaches.  You have vision problems. This information is not intended to replace advice given to you by your health care provider. Make sure you discuss any questions you have with your health care provider. Document Released: 05/28/2004 Document Revised: 09/26/2015 Document Reviewed: 11/17/2012 Elsevier Interactive   Patient Education  2017 [employer]vier Inc.  

## 2017-12-03 NOTE — Assessment & Plan Note (Signed)
Pt is taking iron Check labs

## 2017-12-03 NOTE — Assessment & Plan Note (Signed)
Check labs Pt has not had b12 in a while -- she was taking b 12 sublingual

## 2017-12-03 NOTE — Assessment & Plan Note (Signed)
Check labs ? perimenopause

## 2017-12-05 ENCOUNTER — Other Ambulatory Visit: Payer: Self-pay | Admitting: Family Medicine

## 2017-12-05 ENCOUNTER — Encounter: Payer: Self-pay | Admitting: Family Medicine

## 2017-12-05 DIAGNOSIS — E559 Vitamin D deficiency, unspecified: Secondary | ICD-10-CM

## 2017-12-05 LAB — THYROID PANEL WITH TSH
FREE THYROXINE INDEX: 2.3 (ref 1.4–3.8)
T3 Uptake: 27 % (ref 22–35)
T4 TOTAL: 8.5 ug/dL (ref 5.1–11.9)
TSH: 0.94 mIU/L

## 2017-12-05 LAB — VITAMIN D 1,25 DIHYDROXY
VITAMIN D 1, 25 (OH) TOTAL: 29 pg/mL (ref 18–72)
Vitamin D2 1, 25 (OH)2: <8 pg/mL
Vitamin D3 1, 25 (OH)2: 29 pg/mL

## 2017-12-05 LAB — ESTRADIOL: ESTRADIOL: 20 pg/mL

## 2017-12-05 MED ORDER — VITAMIN D (ERGOCALCIFEROL) 1.25 MG (50000 UNIT) PO CAPS: 50000.0000 [IU] | ORAL_CAPSULE | ORAL | 0 refills | Status: DC

## 2017-12-06 ENCOUNTER — Other Ambulatory Visit: Payer: Self-pay | Admitting: Family Medicine

## 2017-12-06 DIAGNOSIS — E559 Vitamin D deficiency, unspecified: Secondary | ICD-10-CM

## 2017-12-06 MED ORDER — VITAMIN D (ERGOCALCIFEROL) 1.25 MG (50000 UNIT) PO CAPS: 50000.0000 [IU] | ORAL_CAPSULE | ORAL | 0 refills | Status: DC

## 2017-12-06 MED FILL — VIT D2 1.25 MG (50,000 UNIT: 1.25 MG | 84 days supply | Qty: 12 | Fill #0

## 2017-12-06 NOTE — Telephone Encounter (Signed)
-----   Message from Ann Held, DO sent at 12/03/2017  4:18 PM EDT ----- Hgb, ibc-- normal-- con't otc iron b12 low normal---  con't monthly b12 injections Waiting for rest of the homone levels

## 2018-02-05 DIAGNOSIS — H5213 Myopia, bilateral: Secondary | ICD-10-CM | POA: Diagnosis not present

## 2019-01-03 ENCOUNTER — Other Ambulatory Visit: Payer: Self-pay

## 2019-01-03 ENCOUNTER — Ambulatory Visit: Payer: 59 | Admitting: Family Medicine

## 2019-01-03 ENCOUNTER — Encounter: Payer: Self-pay | Admitting: Family Medicine

## 2019-01-03 VITALS — BP 100/70 | HR 71 | Temp 97.3°F | Resp 12 | Wt 172.4 lb

## 2019-01-03 DIAGNOSIS — R079 Chest pain, unspecified: Secondary | ICD-10-CM

## 2019-01-03 DIAGNOSIS — F439 Reaction to severe stress, unspecified: Secondary | ICD-10-CM | POA: Diagnosis not present

## 2019-01-03 DIAGNOSIS — R002 Palpitations: Secondary | ICD-10-CM | POA: Diagnosis not present

## 2019-01-03 LAB — COMPREHENSIVE METABOLIC PANEL
ALT: 8 U/L (ref 0–35)
AST: 18 U/L (ref 0–37)
Albumin: 4.2 g/dL (ref 3.5–5.2)
Alkaline Phosphatase: 67 U/L (ref 39–117)
BUN: 10 mg/dL (ref 6–23)
CO2: 30 mEq/L (ref 19–32)
Calcium: 9.2 mg/dL (ref 8.4–10.5)
Chloride: 101 mEq/L (ref 96–112)
Creatinine, Ser: 0.67 mg/dL (ref 0.40–1.20)
GFR: 118.03 mL/min (ref >60.00)
Glucose, Bld: 76 mg/dL (ref 70–99)
Potassium: 3.8 mEq/L (ref 3.5–5.1)
Sodium: 138 mEq/L (ref 135–145)
Total Bilirubin: 0.3 mg/dL (ref 0.2–1.2)
Total Protein: 6.8 g/dL (ref 6.0–8.3)

## 2019-01-03 LAB — CBC WITH DIFFERENTIAL/PLATELET
Basophils Absolute: 0 10*3/uL (ref 0.0–0.1)
Basophils Relative: 1.1 % (ref 0.0–3.0)
Eosinophils Absolute: 0.1 10*3/uL (ref 0.0–0.7)
Eosinophils Relative: 2.9 % (ref 0.0–5.0)
HCT: 38.8 % (ref 36.0–46.0)
Hemoglobin: 12.7 g/dL (ref 12.0–15.0)
Lymphocytes Relative: 47.3 % — ABNORMAL HIGH (ref 12.0–46.0)
Lymphs Abs: 1.7 10*3/uL (ref 0.7–4.0)
MCHC: 32.9 g/dL (ref 30.0–36.0)
MCV: 87.1 fl (ref 78.0–100.0)
Monocytes Absolute: 0.2 10*3/uL (ref 0.1–1.0)
Monocytes Relative: 5.2 % (ref 3.0–12.0)
Neutro Abs: 1.6 10*3/uL (ref 1.4–7.7)
Neutrophils Relative %: 43.5 % (ref 43.0–77.0)
Platelets: 228 10*3/uL (ref 150.0–400.0)
RBC: 4.45 Mil/uL (ref 3.87–5.11)
RDW: 14.3 % (ref 11.5–15.5)
WBC: 3.6 10*3/uL — ABNORMAL LOW (ref 4.0–10.5)

## 2019-01-03 LAB — D-DIMER, QUANTITATIVE: D-Dimer, Quant: <0.19 mcg/mL FEU (ref <0.50)

## 2019-01-03 LAB — TROPONIN I (HIGH SENSITIVITY): High Sens Troponin I: <2 ng/L (ref 2–17)

## 2019-01-03 LAB — TSH: TSH: 1.2 u[IU]/mL (ref 0.35–4.50)

## 2019-01-03 LAB — VITAMIN B12: Vitamin B-12: 686 pg/mL (ref 211–911)

## 2019-01-03 MED ORDER — ALPRAZOLAM 0.25 MG PO TABS: 0.2500 mg | ORAL_TABLET | Freq: Two times a day (BID) | ORAL | 0 refills | Status: DC | PRN

## 2019-01-03 NOTE — Assessment & Plan Note (Signed)
Check labs  If pain returns -- please go to ER

## 2019-01-03 NOTE — Progress Notes (Signed)
Patient ID: Anna [name], female    DOB: 11-Jun-1980  Age: 38 y.o. MRN: [id number]    Subjective:  Subjective  HPI ERNA DIOSDADO presents for palpitations and chest pain that started Friday and finally stopped yesterday.  She did have more palpitations this am but no more cp.  No sob.  Pt stopped drinking caffeine Friday when it started .        She is not having any symptoms now.   Review of Systems  Constitutional: Negative for activity change, appetite change, fatigue and unexpected weight change.  Respiratory: Negative for cough and shortness of breath.   Cardiovascular: Positive for chest pain and palpitations.  Psychiatric/Behavioral: Negative for behavioral problems and dysphoric mood. The patient is not nervous/anxious.     History Past Medical History:  Diagnosis Date   Allergy    Anemia    Blood transfusion without reported diagnosis    at birth   GERD (gastroesophageal reflux disease)    IBS (irritable bowel syndrome)    Migraine    PVC (premature ventricular contraction)     She has a past surgical history that includes Wisdom tooth extraction (2010); Tubal ligation (06-04-2002); Endometrial ablation; Cholecystectomy (N/A, 09/12/2015); Radioactive seed guided excisional breast biopsy (Right, 09/12/2015); and Breast excisional biopsy.   Her family history includes Arthritis in her maternal grandmother and maternal uncle; Bone cancer in her paternal grandmother; Breast cancer in her paternal grandmother; Colon cancer in her maternal grandmother and maternal uncle; Diabetes in her maternal grandfather, maternal grandmother, and paternal grandmother; Heart disease in her maternal grandfather and maternal uncle; Hypertension in her maternal grandmother; Kidney disease in her maternal grandfather; Kidney failure in her maternal grandfather; Ovarian cancer in her maternal aunt; Prostate cancer in her maternal uncle and maternal uncle; Stroke in her maternal  grandfather.She reports that she has never smoked. She has never used smokeless tobacco. She reports that she does not drink alcohol or use drugs.  Current Outpatient Medications on File Prior to Visit  Medication Sig Dispense Refill   cyanocobalamin (,VITAMIN B-12,) 1000 MCG/ML injection Inject 1 ml or 1000 mcg of Vit B12 into muscle monthly for 1 year 12 mL 1   levocetirizine (XYZAL) 5 MG tablet Take 1 tablet (5 mg total) by mouth every evening. 90 tablet 3   Probiotic Product (ALIGN) 4 MG CAPS Take 1 capsule by mouth every other day.     UNABLE TO FIND Place under the tongue daily. Med Name: B complex liquid     Vitamin D, Ergocalciferol, (DRISDOL) 50000 units CAPS capsule Take 50,000 Units by mouth daily.     Vitamin D, Ergocalciferol, (DRISDOL) 50000 units CAPS capsule Take 1 capsule (50,000 Units total) by mouth every 7 (seven) days. 12 capsule 0   No current facility-administered medications on file prior to visit.      Objective:  Objective  Physical Exam Vitals signs and nursing note reviewed.  Constitutional:      Appearance: She is well-developed.  HENT:     Head: Normocephalic and atraumatic.  Eyes:     Conjunctiva/sclera: Conjunctivae normal.  Neck:     Musculoskeletal: Normal range of motion and neck supple.     Thyroid: No thyromegaly.     Vascular: No carotid bruit or JVD.  Cardiovascular:     Rate and Rhythm: Normal rate and regular rhythm.     Heart sounds: Normal heart sounds. No murmur.  Pulmonary:     Effort: Pulmonary effort is normal. No  respiratory distress.     Breath sounds: Normal breath sounds. No wheezing or rales.  Chest:     Chest wall: No tenderness.  Neurological:     Mental Status: She is alert and oriented to person, place, and time.    BP 100/70 (BP Location: Right Arm, Cuff Size: Normal)    Pulse 71    Temp (!) 97.3 F (36.3 C) (Temporal)    Resp 12    Wt 172 lb 6.4 oz (78.2 kg)    SpO2 98%    BMI 30.54 kg/m  Wt Readings from Last  3 Encounters:  01/03/19 172 lb 6.4 oz (78.2 kg)  12/03/17 159 lb (72.1 kg)  01/27/16 135 lb (61.2 kg)     Lab Results  Component Value Date   WBC 2.9 (L) 12/03/2017   HGB 13.1 12/03/2017   HCT 39.5 12/03/2017   PLT 233.0 12/03/2017   GLUCOSE 91 12/03/2017   CHOL 144 03/16/2014   TRIG 38.0 03/16/2014   HDL 46.40 03/16/2014   LDLCALC 90 03/16/2014   ALT 10 12/03/2017   AST 17 12/03/2017   NA 140 12/03/2017   K 4.1 12/03/2017   CL 101 12/03/2017   CREATININE 0.86 12/03/2017   BUN 13 12/03/2017   CO2 33 (H) 12/03/2017   TSH 0.94 12/03/2017   HGBA1C 5.6 01/21/2016    No results found.   Assessment & Plan:  Plan  I am having Tali P. Russum start on ALPRAZolam. I am also having her maintain her levocetirizine, Align, Vitamin D (Ergocalciferol), UNABLE TO FIND, cyanocobalamin, and Vitamin D (Ergocalciferol).  Meds ordered this encounter  Medications   ALPRAZolam (XANAX) 0.25 MG tablet    Sig: Take 1 tablet (0.25 mg total) by mouth 2 (two) times daily as needed for anxiety.    Dispense:  20 tablet    Refill:  0   ekg--- ns t wave changes , sinus Problem List Items Addressed This Visit      Unprioritized   Chest pain    Check labs  If pain returns -- please go to ER      Relevant Orders   D-Dimer, Quantitative   ECHOCARDIOGRAM COMPLETE   Cardiac event monitor   Troponin I (High Sensitivity) (Completed)   Palpitations - Primary    Refer to cardiology Check labs and echo No acute changes on ekg       Relevant Orders   EKG 12-Lead (Completed)   Vitamin B12   TSH   CBC with Differential/Platelet   Comprehensive metabolic panel   Ambulatory referral to Cardiology   D-Dimer, Quantitative   ECHOCARDIOGRAM COMPLETE   Cardiac event monitor   Troponin I (High Sensitivity) (Completed)   Stress   Relevant Medications   ALPRAZolam (XANAX) 0.25 MG tablet      Follow-up: Return if symptoms worsen or fail to improve.  Ann Held, DO

## 2019-01-03 NOTE — Patient Instructions (Signed)

## 2019-01-03 NOTE — Assessment & Plan Note (Signed)
Refer to cardiology Check labs and echo No acute changes on ekg

## 2019-01-05 ENCOUNTER — Telehealth: Payer: Self-pay | Admitting: Radiology

## 2019-01-05 NOTE — Telephone Encounter (Signed)
Attempted to reach patient to verify info and briefly go over monitor instructions before having the monitor mailed to the patient

## 2019-01-06 NOTE — Telephone Encounter (Signed)
Enrolled patient for a 30 day [employer] Event monitor to be mailed. Brief instructions were gone over with the patient and she knows to expect the monitor to arrive in 3-4 days.  

## 2019-01-06 NOTE — Telephone Encounter (Signed)
Unable to reach patient. Left voicemail to please call [phone] to schedule.

## 2019-01-10 ENCOUNTER — Other Ambulatory Visit: Payer: Self-pay | Admitting: Family Medicine

## 2019-01-10 DIAGNOSIS — B001 Herpesviral vesicular dermatitis: Secondary | ICD-10-CM

## 2019-01-10 MED ORDER — VALACYCLOVIR HCL 1 G PO TABS: 1000.0000 mg | ORAL_TABLET | Freq: Three times a day (TID) | ORAL | 2 refills | Status: DC

## 2019-01-11 ENCOUNTER — Ambulatory Visit (HOSPITAL_COMMUNITY): Payer: 59 | Attending: Cardiology

## 2019-01-11 ENCOUNTER — Telehealth: Payer: Self-pay

## 2019-01-11 ENCOUNTER — Other Ambulatory Visit: Payer: Self-pay

## 2019-01-11 DIAGNOSIS — R002 Palpitations: Secondary | ICD-10-CM

## 2019-01-11 DIAGNOSIS — R079 Chest pain, unspecified: Secondary | ICD-10-CM | POA: Diagnosis present

## 2019-01-11 MED ORDER — PERFLUTREN LIPID MICROSPHERE
1.0000 mL | INTRAVENOUS | Status: AC | PRN
  Administered 2019-01-11: 2 mL via INTRAVENOUS

## 2019-01-11 NOTE — Telephone Encounter (Signed)
Valacyclovir 1gm not covered by insurance, preferred is acyclovir 400mg  tablets. Please advise.

## 2019-01-12 ENCOUNTER — Ambulatory Visit (INDEPENDENT_AMBULATORY_CARE_PROVIDER_SITE_OTHER): Payer: 59

## 2019-01-12 ENCOUNTER — Other Ambulatory Visit: Payer: Self-pay | Admitting: Family Medicine

## 2019-01-12 DIAGNOSIS — R079 Chest pain, unspecified: Secondary | ICD-10-CM | POA: Diagnosis not present

## 2019-01-12 DIAGNOSIS — R002 Palpitations: Secondary | ICD-10-CM | POA: Diagnosis not present

## 2019-01-12 DIAGNOSIS — B001 Herpesviral vesicular dermatitis: Secondary | ICD-10-CM

## 2019-01-12 MED ORDER — ACYCLOVIR 400 MG PO TABS: 400.0000 mg | ORAL_TABLET | Freq: Three times a day (TID) | ORAL | 5 refills | Status: DC

## 2019-01-12 NOTE — Telephone Encounter (Signed)
Acyclovir sent in

## 2019-01-17 ENCOUNTER — Emergency Department (HOSPITAL_BASED_OUTPATIENT_CLINIC_OR_DEPARTMENT_OTHER): Payer: 59

## 2019-01-17 ENCOUNTER — Other Ambulatory Visit: Payer: Self-pay

## 2019-01-17 ENCOUNTER — Emergency Department (HOSPITAL_BASED_OUTPATIENT_CLINIC_OR_DEPARTMENT_OTHER)
Admission: EM | Admit: 2019-01-17 | Discharge: 2019-01-18 | Disposition: A | Discharge status: Home / Self Care | Payer: 59 | Attending: Emergency Medicine | Admitting: Emergency Medicine

## 2019-01-17 DIAGNOSIS — N390 Urinary tract infection, site not specified: Secondary | ICD-10-CM | POA: Diagnosis not present

## 2019-01-17 DIAGNOSIS — Z91040 Latex allergy status: Secondary | ICD-10-CM | POA: Diagnosis not present

## 2019-01-17 DIAGNOSIS — R141 Gas pain: Secondary | ICD-10-CM

## 2019-01-17 DIAGNOSIS — R102 Pelvic and perineal pain: Secondary | ICD-10-CM | POA: Diagnosis present

## 2019-01-17 DIAGNOSIS — Z79899 Other long term (current) drug therapy: Secondary | ICD-10-CM | POA: Diagnosis not present

## 2019-01-17 LAB — CBC WITH DIFFERENTIAL/PLATELET
Abs Immature Granulocytes: 0.01 10*3/uL (ref 0.00–0.07)
Basophils Absolute: 0 10*3/uL (ref 0.0–0.1)
Basophils Relative: 0 %
Eosinophils Absolute: 0.1 10*3/uL (ref 0.0–0.5)
Eosinophils Relative: 2 %
HCT: 40.6 % (ref 36.0–46.0)
Hemoglobin: 12.9 g/dL (ref 12.0–15.0)
Immature Granulocytes: 0 %
Lymphocytes Relative: 39 %
Lymphs Abs: 2.2 10*3/uL (ref 0.7–4.0)
MCH: 28.2 pg (ref 26.0–34.0)
MCHC: 31.8 g/dL (ref 30.0–36.0)
MCV: 88.8 fL (ref 80.0–100.0)
Monocytes Absolute: 0.4 10*3/uL (ref 0.1–1.0)
Monocytes Relative: 8 %
Neutro Abs: 2.8 10*3/uL (ref 1.7–7.7)
Neutrophils Relative %: 51 %
Platelets: 269 10*3/uL (ref 150–400)
RBC: 4.57 MIL/uL (ref 3.87–5.11)
RDW: 13.8 % (ref 11.5–15.5)
WBC: 5.6 10*3/uL (ref 4.0–10.5)
nRBC: 0 % (ref 0.0–0.2)

## 2019-01-17 LAB — URINALYSIS, MICROSCOPIC (REFLEX)

## 2019-01-17 LAB — URINALYSIS, ROUTINE W REFLEX MICROSCOPIC
Bilirubin Urine: NEGATIVE
Glucose, UA: NEGATIVE mg/dL
Ketones, ur: NEGATIVE mg/dL
Nitrite: NEGATIVE
Protein, ur: NEGATIVE mg/dL
Specific Gravity, Urine: 1.01 (ref 1.005–1.030)
pH: 7 (ref 5.0–8.0)

## 2019-01-17 LAB — COMPREHENSIVE METABOLIC PANEL
ALT: 12 U/L (ref 0–44)
AST: 20 U/L (ref 15–41)
Albumin: 4.3 g/dL (ref 3.5–5.0)
Alkaline Phosphatase: 78 U/L (ref 38–126)
Anion gap: 10 (ref 5–15)
BUN: 12 mg/dL (ref 6–20)
CO2: 27 mmol/L (ref 22–32)
Calcium: 9.7 mg/dL (ref 8.9–10.3)
Chloride: 102 mmol/L (ref 98–111)
Creatinine, Ser: 0.68 mg/dL (ref 0.44–1.00)
GFR calc Af Amer: >60 mL/min (ref >60)
GFR calc non Af Amer: >60 mL/min (ref >60)
Glucose, Bld: 80 mg/dL (ref 70–99)
Potassium: 3.8 mmol/L (ref 3.5–5.1)
Sodium: 139 mmol/L (ref 135–145)
Total Bilirubin: 0.2 mg/dL — ABNORMAL LOW (ref 0.3–1.2)
Total Protein: 7.4 g/dL (ref 6.5–8.1)

## 2019-01-17 MED ORDER — PHENAZOPYRIDINE HCL 100 MG PO TABS
200.0000 mg | ORAL_TABLET | Freq: Once | ORAL | Status: AC
  Administered 2019-01-17: 23:00:00 200 mg via ORAL
  Filled 2019-01-17: qty 2

## 2019-01-17 MED ORDER — KETOROLAC TROMETHAMINE 30 MG/ML IJ SOLN
15.0000 mg | Freq: Once | INTRAMUSCULAR | Status: AC
  Administered 2019-01-17: 23:00:00 15 mg via INTRAVENOUS
  Filled 2019-01-17: qty 1

## 2019-01-17 MED ORDER — ALUM & MAG HYDROXIDE-SIMETH 200-200-20 MG/5ML PO SUSP
30.0000 mL | Freq: Once | ORAL | Status: AC
  Administered 2019-01-17: 23:00:00 30 mL via ORAL
  Filled 2019-01-17: qty 30

## 2019-01-17 MED ORDER — CEPHALEXIN 250 MG PO CAPS
500.0000 mg | ORAL_CAPSULE | Freq: Once | ORAL | Status: AC
  Administered 2019-01-17: 23:00:00 500 mg via ORAL
  Filled 2019-01-17: qty 2

## 2019-01-17 MED ORDER — ONDANSETRON HCL 4 MG/2ML IJ SOLN
4.0000 mg | Freq: Once | INTRAMUSCULAR | Status: AC
  Administered 2019-01-17: 21:00:00 4 mg via INTRAVENOUS
  Filled 2019-01-17: qty 2

## 2019-01-17 MED ORDER — FENTANYL CITRATE (PF) 100 MCG/2ML IJ SOLN
50.0000 ug | INTRAMUSCULAR | Status: AC | PRN
  Administered 2019-01-17 (×2): 50 ug via INTRAVENOUS
  Filled 2019-01-17 (×2): qty 2

## 2019-01-17 MED ORDER — ACETAMINOPHEN 500 MG PO TABS
1000.0000 mg | ORAL_TABLET | Freq: Once | ORAL | Status: AC
  Administered 2019-01-17: 23:00:00 1000 mg via ORAL
  Filled 2019-01-17: qty 2

## 2019-01-17 MED ORDER — TAMSULOSIN HCL 0.4 MG PO CAPS: 0.4000 mg | ORAL_CAPSULE | ORAL | Status: DC

## 2019-01-17 NOTE — ED Triage Notes (Signed)
Pt c/o pelvic pain with dysuria and hematuria onset 1 day ago. Today pain radiated to abdomen and across back. Denies N/V

## 2019-01-17 NOTE — ED Notes (Signed)
PT screaming in pain. Pain standing orders intiated.

## 2019-01-18 ENCOUNTER — Encounter (HOSPITAL_BASED_OUTPATIENT_CLINIC_OR_DEPARTMENT_OTHER): Payer: Self-pay | Admitting: Emergency Medicine

## 2019-01-18 ENCOUNTER — Other Ambulatory Visit: Payer: Self-pay | Admitting: Emergency Medicine

## 2019-01-18 MED ORDER — MELOXICAM 7.5 MG PO TABS: 7.5000 mg | ORAL_TABLET | Freq: Every day | ORAL | 0 refills | Status: DC

## 2019-01-18 MED ORDER — CEPHALEXIN 500 MG PO CAPS: 500.0000 mg | ORAL_CAPSULE | Freq: Four times a day (QID) | ORAL | 0 refills | Status: DC

## 2019-01-18 MED ORDER — PHENAZOPYRIDINE HCL 200 MG PO TABS: 200.0000 mg | ORAL_TABLET | Freq: Three times a day (TID) | ORAL | 0 refills | Status: DC

## 2019-01-18 NOTE — ED Provider Notes (Signed)
[facility] EMERGENCY DEPARTMENT Provider Note   CSN: [id number] Arrival date & time: 01/17/19  2010     History   Chief Complaint Chief Complaint  Patient presents with   Abdominal Pain   Pelvic Pain    HPI Anna [name] is a 38 y.o. female.     The history is provided by the patient.  Abdominal Pain Pain location:  Generalized Pain quality: cramping   Pain radiates to:  Groin Pain severity:  Severe Onset quality:  Gradual Duration:  3 days Timing:  Constant Progression:  Unchanged Chronicity:  New Context: not diet changes, not laxative use, not medication withdrawal, not retching, not sick contacts, not suspicious food intake and not trauma   Relieved by:  Nothing Worsened by:  Nothing Ineffective treatments:  None tried Associated symptoms: dysuria and hematuria   Associated symptoms: no belching, no chest pain, no constipation, no cough, no diarrhea, no fever, no nausea, no shortness of breath, no sore throat, no vaginal bleeding, no vaginal discharge and no vomiting   Risk factors: not pregnant   Pelvic Pain Associated symptoms include abdominal pain. Pertinent negatives include no chest pain and no shortness of breath.    Past Medical History:  Diagnosis Date   Allergy    Anemia    Blood transfusion without reported diagnosis    at birth   GERD (gastroesophageal reflux disease)    IBS (irritable bowel syndrome)    Migraine    PVC (premature ventricular contraction)     Patient Active Problem List   Diagnosis Date Noted   Palpitations 01/03/2019   Chest pain 01/03/2019   Stress 01/03/2019   Fatigue 12/03/2017   Iron deficiency anemia 12/03/2017   Vitamin B 12 deficiency 12/03/2017   Hot flashes [id number]   Colicky RUQ abdominal pain 11/23/2014   Pain of left calf 09/11/2013   PVC's (premature ventricular contractions) 10/27/2011   Irritable bowel syndrome 10/14/2011   Headache(784.0) 10/14/2011   Knee  pain, left 09/23/2010    Past Surgical History:  Procedure Laterality Date   BREAST EXCISIONAL BIOPSY     CHOLECYSTECTOMY N/A 09/12/2015   Procedure: LAPAROSCOPIC CHOLECYSTECTOMY;  Surgeon: Rolm Bookbinder, MD;  Location: [facility];  Service: General;  Laterality: N/A;   ENDOMETRIAL ABLATION     RADIOACTIVE SEED GUIDED EXCISIONAL BREAST BIOPSY Right 09/12/2015   Procedure: RADIOACTIVE SEED GUIDED EXCISIONAL RIGHT BREAST BIOPSY;  Surgeon: Rolm Bookbinder, MD;  Location: [facility];  Service: General;  Laterality: Right;   TUBAL LIGATION  06-04-2002   WISDOM TOOTH EXTRACTION  2010     OB History    Gravida  2   Para  2   Term  2   Preterm      AB      Living  2     SAB      TAB      Ectopic      Multiple      Live Births  2            Home Medications    Prior to Admission medications   Medication Sig Start Date End Date Taking? Authorizing Provider  cyanocobalamin (,VITAMIN B-12,) 1000 MCG/ML injection Inject 1 ml or 1000 mcg of Vit B12 into muscle monthly for 1 year 12/03/17  Yes Lowne Chase, Alferd Apa, DO  acyclovir (ZOVIRAX) 400 MG tablet Take 1 tablet (400 mg total) by mouth 3 (three) times daily. 01/12/19   Roma Schanz  R, DO  ALPRAZolam (XANAX) 0.25 MG tablet Take 1 tablet (0.25 mg total) by mouth 2 (two) times daily as needed for anxiety. 01/03/19   Ann Held, DO  cephALEXin (KEFLEX) 500 MG capsule Take 1 capsule (500 mg total) by mouth 4 (four) times daily. 01/18/19   Taccara Bushnell, MD  levocetirizine (XYZAL) 5 MG tablet Take 1 tablet (5 mg total) by mouth every evening. 02/21/15   Ann Held, DO  meloxicam (MOBIC) 7.5 MG tablet Take 1 tablet (7.5 mg total) by mouth daily. 01/18/19   Najai Waszak, MD  phenazopyridine (PYRIDIUM) 200 MG tablet Take 1 tablet (200 mg total) by mouth 3 (three) times daily. 01/18/19   Madilynne Mullan, MD  Probiotic Product (ALIGN) 4 MG CAPS Take 1 capsule by mouth  every other day.    [provider]  UNABLE TO FIND Place under the tongue daily. Med Name: B complex liquid    [provider]  valACYclovir (VALTREX) 1000 MG tablet Take 1 tablet (1,000 mg total) by mouth 3 (three) times daily. 01/10/19   Ann Held, DO  Vitamin D, Ergocalciferol, (DRISDOL) 50000 units CAPS capsule Take 50,000 Units by mouth daily.    [provider]  Vitamin D, Ergocalciferol, (DRISDOL) 50000 units CAPS capsule Take 1 capsule (50,000 Units total) by mouth every 7 (seven) days. 12/06/17   Ann Held, DO    Family History Family History  Problem Relation Age of Onset   Arthritis Maternal Uncle    Heart disease Maternal Uncle    Arthritis Maternal Grandmother    Colon cancer Maternal Grandmother    Hypertension Maternal Grandmother    Diabetes Maternal Grandmother    Ovarian cancer Maternal Aunt    Prostate cancer Maternal Uncle    Stroke Maternal Grandfather    Kidney disease Maternal Grandfather    Heart disease Maternal Grandfather    Kidney failure Maternal Grandfather    Diabetes Maternal Grandfather    Diabetes Paternal Grandmother    Bone cancer Paternal Grandmother    Breast cancer Paternal Grandmother    Prostate cancer Maternal Uncle    Colon cancer Maternal Uncle    Esophageal cancer Neg Hx    Rectal cancer Neg Hx    Stomach cancer Neg Hx     Social History Social History   Tobacco Use   Smoking status: Never Smoker   Smokeless tobacco: Never Used  Substance Use Topics   Alcohol use: No   Drug use: No     Allergies   Aciphex [rabeprazole sodium], Latex, and Rabeprazole sodium   Review of Systems Review of Systems  Constitutional: Negative for fever.  HENT: Negative for sore throat.   Eyes: Negative for visual disturbance.  Respiratory: Negative for cough and shortness of breath.   Cardiovascular: Negative for chest pain.  Gastrointestinal: Positive for abdominal  pain. Negative for constipation, diarrhea, nausea and vomiting.  Genitourinary: Positive for dysuria, hematuria and pelvic pain. Negative for vaginal bleeding and vaginal discharge.  Musculoskeletal: Negative for arthralgias.  Neurological: Negative for dizziness.  Psychiatric/Behavioral: Negative for agitation.  All other systems reviewed and are negative.    Physical Exam Updated Vital Signs BP (!) 98/58 (BP Location: Left Arm)    Pulse 73    Temp 98.8 F (37.1 C) (Oral)    Resp 20    Ht 5\' 3"  (1.6 m)    Wt 78 kg    SpO2 99%    BMI 30.47  kg/m   Physical Exam Vitals signs and nursing note reviewed.  Constitutional:      General: She is not in acute distress.    Appearance: She is normal weight.  HENT:     Head: Normocephalic and atraumatic.     Nose: Nose normal.  Eyes:     Conjunctiva/sclera: Conjunctivae normal.     Pupils: Pupils are equal, round, and reactive to light.  Neck:     Musculoskeletal: Normal range of motion and neck supple.  Cardiovascular:     Rate and Rhythm: Normal rate and regular rhythm.     Pulses: Normal pulses.     Heart sounds: Normal heart sounds.  Pulmonary:     Effort: Pulmonary effort is normal.     Breath sounds: Normal breath sounds.  Abdominal:     General: Abdomen is flat.     Tenderness: There is no abdominal tenderness. There is no guarding or rebound.     Comments: Gassy throughout  Musculoskeletal: Normal range of motion.  Skin:    General: Skin is warm and dry.     Capillary Refill: Capillary refill takes less than 2 seconds.  Neurological:     General: No focal deficit present.     Mental Status: She is alert and oriented to person, place, and time.  Psychiatric:        Mood and Affect: Mood normal.        Behavior: Behavior normal.      ED Treatments / Results  Labs (all labs ordered are listed, but only abnormal results are displayed) Labs Reviewed  COMPREHENSIVE METABOLIC PANEL - Abnormal; Notable for the following  components:      Result Value   Total Bilirubin 0.2 (*)    All other components within normal limits  URINALYSIS, ROUTINE W REFLEX MICROSCOPIC - Abnormal; Notable for the following components:   APPearance CLOUDY (*)    Hgb urine dipstick LARGE (*)    Leukocytes,Ua LARGE (*)    All other components within normal limits  URINALYSIS, MICROSCOPIC (REFLEX) - Abnormal; Notable for the following components:   Bacteria, UA FEW (*)    All other components within normal limits  CBC WITH DIFFERENTIAL/PLATELET    EKG None  Radiology Ct Renal Stone Study  Result Date: 01/17/2019 CLINICAL DATA:  Pelvic pain and dysuria for 1 day EXAM: CT ABDOMEN AND PELVIS WITHOUT CONTRAST TECHNIQUE: Multidetector CT imaging of the abdomen and pelvis was performed following the standard protocol without IV contrast. COMPARISON:  None. FINDINGS: Lower chest: No acute abnormality. Hepatobiliary: No focal liver abnormality is seen. Status post cholecystectomy. No biliary dilatation. Pancreas: Unremarkable. No pancreatic ductal dilatation or surrounding inflammatory changes. Spleen: Normal in size without focal abnormality. Adrenals/Urinary Tract: Adrenal glands are within normal limits. Kidneys are well visualized bilaterally. No renal calculi or obstructive changes are seen. The bladder is well distended. No focal abnormality is seen. Stomach/Bowel: The appendix is within normal limits. The large and small bowel show no significant obstructive or inflammatory changes. The stomach is within normal limits. Vascular/Lymphatic: No significant vascular findings are present. No enlarged abdominal or pelvic lymph nodes. Reproductive: Uterus is within normal limits. Small left ovarian cysts are seen. Other: No abdominal wall hernia or abnormality. No abdominopelvic ascites. Musculoskeletal: No acute or significant osseous findings. IMPRESSION: No renal or ureteral calculi are identified. No acute abnormality seen. Electronically  Signed   By: Inez Catalina M.D.   On: 01/17/2019 23:13    Procedures Procedures (including  critical care time)  Medications Ordered in ED Medications  fentaNYL (SUBLIMAZE) injection 50 mcg (50 mcg Intravenous Given 01/17/19 2125)  ondansetron (ZOFRAN) injection 4 mg (4 mg Intravenous Given 01/17/19 2042)  ketorolac (TORADOL) 30 MG/ML injection 15 mg (15 mg Intravenous Given 01/17/19 2325)  alum & mag hydroxide-simeth (MAALOX/MYLANTA) 200-200-20 MG/5ML suspension 30 mL (30 mLs Oral Given 01/17/19 2322)  acetaminophen (TYLENOL) tablet 1,000 mg (1,000 mg Oral Given 01/17/19 2324)  cephALEXin (KEFLEX) capsule 500 mg (500 mg Oral Given 01/17/19 2323)  phenazopyridine (PYRIDIUM) tablet 200 mg (200 mg Oral Given 01/17/19 2323)     Symptoms consistent with Cystitis with superimposed gas pain likely from patient's known IBSo.  No stones.  No signs of pyelonephritis.  Markedly imprved post medication.  No signs of sepsis. Anna [name] was evaluated in Emergency Department on 01/18/2019 for the symptoms described in the history of present illness. She was evaluated in the context of the global COVID-19 pandemic, which necessitated consideration that the patient might be at risk for infection with the SARS-CoV-2 virus that causes COVID-19. Institutional protocols and algorithms that pertain to the evaluation of patients at risk for COVID-19 are in a state of rapid change based on information released by regulatory bodies including the CDC and federal and state organizations. These policies and algorithms were followed during the patient's care in the ED.   Final Clinical Impressions(s) / ED Diagnoses   Final diagnoses:  Lower urinary tract infectious disease  Gas pain   Return for intractable cough, coughing up blood,fevers >100.4 unrelieved by medication, shortness of breath, intractable vomiting, chest pain, shortness of breath, weakness,numbness, changes in speech, facial asymmetry,abdominal  pain, passing out,Inability to tolerate liquids or food, cough, altered mental status or any concerns. No signs of systemic illness or infection. The patient is nontoxic-appearing on exam and vital signs are within normal limits.   I have reviewed the triage vital signs and the nursing notes. Pertinent labs &imaging results that were available during my care of the patient were reviewed by me and considered in my medical decision making (see chart for details).After history, exam, and medical workup I feel the patient has beenappropriately medically screened and is safe for discharge home. Pertinent diagnoses were discussed with the patient. Patient was given return precautions. ED Discharge Orders         Ordered    cephALEXin (KEFLEX) 500 MG capsule  4 times daily     01/18/19 0037    phenazopyridine (PYRIDIUM) 200 MG tablet  3 times daily     01/18/19 0037    meloxicam (MOBIC) 7.5 MG tablet  Daily     01/18/19 0042           Bettyjo Lundblad, MD 01/18/19 [id number]

## 2019-03-04 NOTE — Progress Notes (Deleted)
Cardiology Office Note   Date:  03/04/2019   ID:  Montie, Abbasi [birth year], MRN [id number]  PCP:  Carollee Herter, Alferd Apa, DO  Cardiologist:   Peter Martinique, MD   No chief complaint on file.     History of Present Illness: Anna [name] is a 38 y.o. female who is seen at the request of Dr Etter Sjogren for evaluation of palpitations. She was seen by Dr Etter Sjogren in September with complaint of palpitations. CBC, CMET, TSH, D dimer, and troponin were negative. Ecg showed T wave inversion in leads v1-4. Echo was normal. An Event monitor was ordered.   She had prior evaluation in 2011 with [facility] cardiology showing PVCs. I saw her in 2013 for similar complaints. Echo was normal. Prn beta blocker prescribed.     Past Medical History:  Diagnosis Date  . Allergy   . Anemia   . Blood transfusion without reported diagnosis    at birth  . GERD (gastroesophageal reflux disease)   . IBS (irritable bowel syndrome)   . Migraine   . PVC (premature ventricular contraction)     Past Surgical History:  Procedure Laterality Date  . BREAST EXCISIONAL BIOPSY    . CHOLECYSTECTOMY N/A 09/12/2015   Procedure: LAPAROSCOPIC CHOLECYSTECTOMY;  Surgeon: Rolm Bookbinder, MD;  Location: [place];  Service: General;  Laterality: N/A;  . ENDOMETRIAL ABLATION    . RADIOACTIVE SEED GUIDED EXCISIONAL BREAST BIOPSY Right 09/12/2015   Procedure: RADIOACTIVE SEED GUIDED EXCISIONAL RIGHT BREAST BIOPSY;  Surgeon: Rolm Bookbinder, MD;  Location: [place];  Service: General;  Laterality: Right;  . TUBAL LIGATION  06-04-2002  . WISDOM TOOTH EXTRACTION  2010     Current Outpatient Medications  Medication Sig Dispense Refill  . acyclovir (ZOVIRAX) 400 MG tablet Take 1 tablet (400 mg total) by mouth 3 (three) times daily. 30 tablet 5  . ALPRAZolam (XANAX) 0.25 MG tablet Take 1 tablet (0.25 mg total) by mouth 2 (two) times daily as needed for anxiety. 20 tablet 0  .  cephALEXin (KEFLEX) 500 MG capsule Take 1 capsule (500 mg total) by mouth 4 (four) times daily. 28 capsule 0  . cyanocobalamin (,VITAMIN B-12,) 1000 MCG/ML injection Inject 1 ml or 1000 mcg of Vit B12 into muscle monthly for 1 year 12 mL 1  . levocetirizine (XYZAL) 5 MG tablet Take 1 tablet (5 mg total) by mouth every evening. 90 tablet 3  . meloxicam (MOBIC) 7.5 MG tablet Take 1 tablet (7.5 mg total) by mouth daily. 7 tablet 0  . phenazopyridine (PYRIDIUM) 200 MG tablet Take 1 tablet (200 mg total) by mouth 3 (three) times daily. 6 tablet 0  . Probiotic Product (ALIGN) 4 MG CAPS Take 1 capsule by mouth every other day.    Marland Kitchen UNABLE TO FIND Place under the tongue daily. Med Name: B complex liquid    . valACYclovir (VALTREX) 1000 MG tablet Take 1 tablet (1,000 mg total) by mouth 3 (three) times daily. 30 tablet 2  . Vitamin D, Ergocalciferol, (DRISDOL) 50000 units CAPS capsule Take 50,000 Units by mouth daily.    . Vitamin D, Ergocalciferol, (DRISDOL) 50000 units CAPS capsule Take 1 capsule (50,000 Units total) by mouth every 7 (seven) days. 12 capsule 0   No current facility-administered medications for this visit.     Allergies:   Aciphex [rabeprazole sodium], Latex, and Rabeprazole sodium    Social History:  The patient  reports that she has never smoked.  She has never used smokeless tobacco. She reports that she does not drink alcohol or use drugs.   Family History:  The patient's ***family history includes Arthritis in her maternal grandmother and maternal uncle; Bone cancer in her paternal grandmother; Breast cancer in her paternal grandmother; Colon cancer in her maternal grandmother and maternal uncle; Diabetes in her maternal grandfather, maternal grandmother, and paternal grandmother; Heart disease in her maternal grandfather and maternal uncle; Hypertension in her maternal grandmother; Kidney disease in her maternal grandfather; Kidney failure in her maternal grandfather; Ovarian cancer  in her maternal aunt; Prostate cancer in her maternal uncle and maternal uncle; Stroke in her maternal grandfather.    ROS:  Please see the history of present illness.   Otherwise, review of systems are positive for {NONE DEFAULTED:18576::"none"}.   All other systems are reviewed and negative.    PHYSICAL EXAM: VS:  There were no vitals taken for this visit. , BMI There is no height or weight on file to calculate BMI. GEN: Well nourished, well developed, in no acute distress  HEENT: normal  Neck: no JVD, carotid bruits, or masses Cardiac: ***RRR; no murmurs, rubs, or gallops,no edema  Respiratory:  clear to auscultation bilaterally, normal work of breathing GI: soft, nontender, nondistended, + BS MS: no deformity or atrophy  Skin: warm and dry, no rash Neuro:  Strength and sensation are intact Psych: euthymic mood, full affect   EKG:  EKG {ACTION; IS/IS GI:[id number] ordered today. The ekg ordered today demonstrates ***   Recent Labs: 01/03/2019: TSH 1.20 01/17/2019: ALT 12; BUN 12; Creatinine, Ser 0.68; Hemoglobin 12.9; Platelets 269; Potassium 3.8; Sodium 139    Lipid Panel    Component Value Date/Time   CHOL 144 03/16/2014 1004   TRIG 38.0 03/16/2014 1004   HDL 46.40 03/16/2014 1004   CHOLHDL 3 03/16/2014 1004   VLDL 7.6 03/16/2014 1004   LDLCALC 90 03/16/2014 1004      Wt Readings from Last 3 Encounters:  01/17/19 172 lb (78 kg)  01/03/19 172 lb 6.4 oz (78.2 kg)  12/03/17 159 lb (72.1 kg)      Other studies Reviewed: Additional studies/ records that were reviewed today include:  Echo 01/11/19: IMPRESSIONS    1. The average left ventricular global longitudinal strain is -21.9 %.  2. The left ventricle has normal systolic function with an ejection fraction of 60-65%. The cavity size was normal. Left ventricular diastolic parameters were normal.  3. The right ventricle has normal systolic function. The cavity was normal. There is no increase in right ventricular  wall thickness.  4. The aortic valve was not well visualized. Aortic valve regurgitation is trivial by color flow Doppler.  5. The aorta is normal unless otherwise noted.    ASSESSMENT AND PLAN:  1.  ***   Current medicines are reviewed at length with the patient today.  The patient {ACTIONS; HAS/DOES NOT HAVE:19233} concerns regarding medicines.  The following changes have been made:  {PLAN; NO CHANGE:13088:s}  Labs/ tests ordered today include: *** No orders of the defined types were placed in this encounter.    Disposition:   FU with *** in {gen number [id number] {Days to years:10300}  Signed, Peter Martinique, MD  03/04/2019 4:35 PM    [facility] [address] [place], [place], [address] Phone [phone], Fax [phone]

## 2019-03-07 ENCOUNTER — Ambulatory Visit: Payer: 59 | Admitting: Cardiology

## 2019-06-09 DIAGNOSIS — Z780 Asymptomatic menopausal state: Secondary | ICD-10-CM | POA: Insufficient documentation

## 2020-02-27 ENCOUNTER — Other Ambulatory Visit: Payer: Self-pay

## 2020-02-27 ENCOUNTER — Encounter: Payer: Self-pay | Admitting: Family Medicine

## 2020-02-27 ENCOUNTER — Ambulatory Visit: Payer: 59 | Admitting: Family Medicine

## 2020-02-27 VITALS — BP 118/82 | HR 67 | Temp 98.2°F | Resp 18 | Ht 63.0 in | Wt 180.0 lb

## 2020-02-27 DIAGNOSIS — R519 Headache, unspecified: Secondary | ICD-10-CM | POA: Diagnosis not present

## 2020-02-27 DIAGNOSIS — G8929 Other chronic pain: Secondary | ICD-10-CM | POA: Diagnosis not present

## 2020-02-27 NOTE — Patient Instructions (Signed)
General Headache Without Cause A headache is pain or discomfort felt around the head or neck area. The specific cause of a headache may not be found. There are many causes and types of headaches. A few common ones are:  Tension headaches.  Migraine headaches.  Cluster headaches.  Chronic daily headaches. Follow these instructions at home: Watch your condition for any changes. Let your health care provider know about them. Take these steps to help with your condition: Managing pain      Take over-the-counter and prescription medicines only as told by your health care provider.  Lie down in a dark, quiet room when you have a headache.  If directed, put ice on your head and neck area: ? Put ice in a plastic bag. ? Place a towel between your skin and the bag. ? Leave the ice on for 20 minutes, 2-3 times per day.  If directed, apply heat to the affected area. Use the heat source that your health care provider recommends, such as a moist heat pack or a heating pad. ? Place a towel between your skin and the heat source. ? Leave the heat on for 20-30 minutes. ? Remove the heat if your skin turns bright red. This is especially important if you are unable to feel pain, heat, or cold. You may have a greater risk of getting burned.  Keep lights dim if bright lights bother you or make your headaches worse. Eating and drinking  Eat meals on a regular schedule.  If you drink alcohol: ? Limit how much you use to:  0-1 drink a day for women.  0-2 drinks a day for men. ? Be aware of how much alcohol is in your drink. In the [place] one drink equals one 12 oz bottle of beer (355 mL), one 5 oz glass of wine (148 mL), or one 1 oz glass of hard liquor (44 mL).  Stop drinking caffeine, or decrease the amount of caffeine you drink. General instructions   Keep a headache journal to help find out what may trigger your headaches. For example, write down: ? What you eat and drink. ? How much  sleep you get. ? Any change to your diet or medicines.  Try massage or other relaxation techniques.  Limit stress.  Sit up straight, and do not tense your muscles.  Do not use any products that contain nicotine or tobacco, such as cigarettes, e-cigarettes, and chewing tobacco. If you need help quitting, ask your health care provider.  Exercise regularly as told by your health care provider.  Sleep on a regular schedule. Get 7-9 hours of sleep each night, or the amount recommended by your health care provider.  Keep all follow-up visits as told by your health care provider. This is important. Contact a health care provider if:  Your symptoms are not helped by medicine.  You have a headache that is different from the usual headache.  You have nausea or you vomit.  You have a fever. Get help right away if:  Your headache becomes severe quickly.  Your headache gets worse after moderate to intense physical activity.  You have repeated vomiting.  You have a stiff neck.  You have a loss of vision.  You have problems with speech.  You have pain in the eye or ear.  You have muscular weakness or loss of muscle control.  You lose your balance or have trouble walking.  You feel faint or pass out.  You have confusion.    You have a seizure. Summary  A headache is pain or discomfort felt around the head or neck area.  There are many causes and types of headaches. In some cases, the cause may not be found.  Keep a headache journal to help find out what may trigger your headaches. Watch your condition for any changes. Let your health care provider know about them.  Contact a health care provider if you have a headache that is different from the usual headache, or if your symptoms are not helped by medicine.  Get help right away if your headache becomes severe, you vomit, you have a loss of vision, you lose your balance, or you have a seizure. This information is not  intended to replace advice given to you by your health care provider. Make sure you discuss any questions you have with your health care provider. Document Revised: 11/08/2017 Document Reviewed: 11/08/2017 Elsevier Patient Education  2020 Elsevier Inc.  

## 2020-02-27 NOTE — Progress Notes (Signed)
Patient ID: Anna [name], female    DOB: August 29, 1980  Age: 39 y.o. MRN: [id number]    Subjective:  Subjective  HPI Anna [name] presents for daily dull headaches x 12 weeks .  + some memory issues when she has a headache.  She is also having a hard time losing weight --- she just keeps gaining .    She has not had a period since her ablation She is exercising regularly and eating healthy per pt.  No vision changes,  No weakness, no speech changes    Headache does go away with meds but she does not like to take meds     Review of Systems  Constitutional: Negative for activity change, appetite change, chills, diaphoresis, fatigue, fever and unexpected weight change.  Eyes: Negative for pain, redness and visual disturbance.  Respiratory: Negative for cough, chest tightness, shortness of breath and wheezing.   Cardiovascular: Negative for chest pain, palpitations and leg swelling.  Gastrointestinal: Negative for abdominal distention and abdominal pain.  Endocrine: Negative for cold intolerance, heat intolerance, polydipsia, polyphagia and polyuria.  Genitourinary: Negative for difficulty urinating, dyspareunia, dysuria, flank pain, frequency, genital sores, hematuria, menstrual problem, pelvic pain, urgency, vaginal discharge and vaginal pain.  Musculoskeletal: Negative for back pain.  Neurological: Negative for dizziness, light-headedness, numbness and headaches.    History Past Medical History:  Diagnosis Date  . Allergy   . Anemia   . Blood transfusion without reported diagnosis    at birth  . GERD (gastroesophageal reflux disease)   . IBS (irritable bowel syndrome)   . Migraine   . PVC (premature ventricular contraction)     She has a past surgical history that includes Wisdom tooth extraction (2010); Tubal ligation (06-04-2002); Endometrial ablation; Cholecystectomy (N/A, 09/12/2015); Radioactive seed guided excisional breast biopsy (Right, 09/12/2015); and Breast  excisional biopsy.   Her family history includes Arthritis in her maternal grandmother and maternal uncle; Bone cancer in her paternal grandmother; Breast cancer in her paternal grandmother; Colon cancer in her maternal grandmother and maternal uncle; Diabetes in her maternal grandfather, maternal grandmother, and paternal grandmother; Heart disease in her maternal grandfather and maternal uncle; Hypertension in her maternal grandmother; Kidney disease in her maternal grandfather; Kidney failure in her maternal grandfather; Ovarian cancer in her maternal aunt; Prostate cancer in her maternal uncle and maternal uncle; Stroke in her maternal grandfather.She reports that she has never smoked. She has never used smokeless tobacco. She reports that she does not drink alcohol and does not use drugs.  Current Outpatient Medications on File Prior to Visit  Medication Sig Dispense Refill  . acyclovir (ZOVIRAX) 400 MG tablet Take 1 tablet (400 mg total) by mouth 3 (three) times daily. 30 tablet 5  . ALPRAZolam (XANAX) 0.25 MG tablet Take 1 tablet (0.25 mg total) by mouth 2 (two) times daily as needed for anxiety. 20 tablet 0  . cyanocobalamin (,VITAMIN B-12,) 1000 MCG/ML injection Inject 1 ml or 1000 mcg of Vit B12 into muscle monthly for 1 year 12 mL 1  . levocetirizine (XYZAL) 5 MG tablet Take 1 tablet (5 mg total) by mouth every evening. 90 tablet 3  . meloxicam (MOBIC) 7.5 MG tablet Take 1 tablet (7.5 mg total) by mouth daily. 7 tablet 0  . phenazopyridine (PYRIDIUM) 200 MG tablet Take 1 tablet (200 mg total) by mouth 3 (three) times daily. 6 tablet 0  . Probiotic Product (ALIGN) 4 MG CAPS Take 1 capsule by mouth every other day.    Marland Kitchen  UNABLE TO FIND Place under the tongue daily. Med Name: B complex liquid    . valACYclovir (VALTREX) 1000 MG tablet Take 1 tablet (1,000 mg total) by mouth 3 (three) times daily. 30 tablet 2  . Vitamin D, Ergocalciferol, (DRISDOL) 50000 units CAPS capsule Take 50,000 Units by  mouth daily.    . Vitamin D, Ergocalciferol, (DRISDOL) 50000 units CAPS capsule Take 1 capsule (50,000 Units total) by mouth every 7 (seven) days. 12 capsule 0   No current facility-administered medications on file prior to visit.     Objective:  Objective  Physical Exam Vitals and nursing note reviewed.  Constitutional:      Appearance: She is well-developed.  HENT:     Head: Normocephalic and atraumatic.  Eyes:     Conjunctiva/sclera: Conjunctivae normal.  Neck:     Thyroid: No thyromegaly.     Vascular: No carotid bruit or JVD.  Cardiovascular:     Rate and Rhythm: Normal rate and regular rhythm.     Heart sounds: Normal heart sounds. No murmur heard.   Pulmonary:     Effort: Pulmonary effort is normal. No respiratory distress.     Breath sounds: Normal breath sounds. No wheezing or rales.  Chest:     Chest wall: No tenderness.  Musculoskeletal:       Arms:     Cervical back: Normal range of motion and neck supple.  Neurological:     General: No focal deficit present.     Mental Status: She is alert and oriented to person, place, and time.     Motor: Motor function is intact.     Gait: Gait is intact.     Deep Tendon Reflexes: Reflexes normal.  Psychiatric:        Attention and Perception: Attention and perception normal.        Mood and Affect: Mood and affect normal.        Speech: Speech normal.        Thought Content: Thought content normal.    BP 118/82 (BP Location: Left Arm, Patient Position: Sitting, Cuff Size: Normal)   Pulse 67   Temp 98.2 F (36.8 C) (Oral)   Resp 18   Ht 5\' 3"  (1.6 m)   Wt 180 lb (81.6 kg)   SpO2 97%   BMI 31.89 kg/m  Wt Readings from Last 3 Encounters:  02/27/20 180 lb (81.6 kg)  01/17/19 172 lb (78 kg)  01/03/19 172 lb 6.4 oz (78.2 kg)     Lab Results  Component Value Date   WBC 3.7 (L) 02/27/2020   HGB 12.7 02/27/2020   HCT 37.5 02/27/2020   PLT 258 02/27/2020   GLUCOSE 81 02/27/2020   CHOL 144 03/16/2014   TRIG  38.0 03/16/2014   HDL 46.40 03/16/2014   LDLCALC 90 03/16/2014   ALT 10 02/27/2020   AST 20 02/27/2020   NA 140 02/27/2020   K 4.5 02/27/2020   CL 101 02/27/2020   CREATININE 0.71 02/27/2020   BUN 10 02/27/2020   CO2 30 02/27/2020   TSH 1.22 02/27/2020   HGBA1C 5.6 01/21/2016    CT Renal Stone Study  Result Date: 01/17/2019 CLINICAL DATA:  Pelvic pain and dysuria for 1 day EXAM: CT ABDOMEN AND PELVIS WITHOUT CONTRAST TECHNIQUE: Multidetector CT imaging of the abdomen and pelvis was performed following the standard protocol without IV contrast. COMPARISON:  None. FINDINGS: Lower chest: No acute abnormality. Hepatobiliary: No focal liver abnormality is seen. Status post cholecystectomy.  No biliary dilatation. Pancreas: Unremarkable. No pancreatic ductal dilatation or surrounding inflammatory changes. Spleen: Normal in size without focal abnormality. Adrenals/Urinary Tract: Adrenal glands are within normal limits. Kidneys are well visualized bilaterally. No renal calculi or obstructive changes are seen. The bladder is well distended. No focal abnormality is seen. Stomach/Bowel: The appendix is within normal limits. The large and small bowel show no significant obstructive or inflammatory changes. The stomach is within normal limits. Vascular/Lymphatic: No significant vascular findings are present. No enlarged abdominal or pelvic lymph nodes. Reproductive: Uterus is within normal limits. Small left ovarian cysts are seen. Other: No abdominal wall hernia or abnormality. No abdominopelvic ascites. Musculoskeletal: No acute or significant osseous findings. IMPRESSION: No renal or ureteral calculi are identified. No acute abnormality seen. Electronically Signed   By: Inez Catalina M.D.   On: 01/17/2019 23:13     Assessment & Plan:  Plan  I have discontinued Maybelline P. Zervas's cephALEXin. I am also having her maintain her levocetirizine, Align, Vitamin D (Ergocalciferol), UNABLE TO FIND,  cyanocobalamin, Vitamin D (Ergocalciferol), ALPRAZolam, valACYclovir, acyclovir, phenazopyridine, and meloxicam.  No orders of the defined types were placed in this encounter.   Problem List Items Addressed This Visit      Unprioritized   Chronic left-sided headaches - Primary    Take tyleno at the first sign of the headache Check labs refer to chiropratic  rto prn       Relevant Orders   CBC with Differential/Platelet (Completed)   Comprehensive metabolic panel (Completed)   TSH (Completed)   Sedimentation rate (Completed)   Vitamin B12 (Completed)   Vitamin D (25 hydroxy) (Completed)   Ambulatory referral to Chiropractic   Antinuclear Antib (ANA)    Other Visit Diagnoses    Morbid obesity ([place])       Relevant Orders   Amb Ref to Medical Weight Management   CBC with Differential/Platelet (Completed)   Comprehensive metabolic panel (Completed)   TSH (Completed)   Sedimentation rate (Completed)   Vitamin B12 (Completed)   Vitamin D (25 hydroxy) (Completed)   Intractable headache, unspecified chronicity pattern, unspecified headache type       Relevant Orders   Insulin, random      Follow-up: Return if symptoms worsen or fail to improve.  Ann Held, DO

## 2020-02-28 ENCOUNTER — Other Ambulatory Visit: Payer: Self-pay

## 2020-02-28 DIAGNOSIS — G8929 Other chronic pain: Secondary | ICD-10-CM | POA: Insufficient documentation

## 2020-02-28 DIAGNOSIS — E559 Vitamin D deficiency, unspecified: Secondary | ICD-10-CM

## 2020-02-28 MED ORDER — VITAMIN D (ERGOCALCIFEROL) 1.25 MG (50000 UNIT) PO CAPS: 50000.0000 [IU] | ORAL_CAPSULE | ORAL | 0 refills | Status: DC

## 2020-02-28 NOTE — Assessment & Plan Note (Signed)
Take tyleno at the first sign of the headache Check labs refer to chiropratic  rto prn

## 2020-02-29 LAB — CBC WITH DIFFERENTIAL/PLATELET
Absolute Monocytes: 263 cells/uL (ref 200–950)
Basophils Absolute: 19 cells/uL (ref 0–200)
Basophils Relative: 0.5 %
Eosinophils Absolute: 122 cells/uL (ref 15–500)
Eosinophils Relative: 3.3 %
HCT: 37.5 % (ref 35.0–45.0)
Hemoglobin: 12.7 g/dL (ref 11.7–15.5)
Lymphs Abs: 1946 cells/uL (ref 850–3900)
MCH: 29.4 pg (ref 27.0–33.0)
MCHC: 33.9 g/dL (ref 32.0–36.0)
MCV: 86.8 fL (ref 80.0–100.0)
MPV: 11.9 fL (ref 7.5–12.5)
Monocytes Relative: 7.1 %
Neutro Abs: 1351 cells/uL — ABNORMAL LOW (ref 1500–7800)
Neutrophils Relative %: 36.5 %
Platelets: 258 10*3/uL (ref 140–400)
RBC: 4.32 10*6/uL (ref 3.80–5.10)
RDW: 12.8 % (ref 11.0–15.0)
Total Lymphocyte: 52.6 %
WBC: 3.7 10*3/uL — ABNORMAL LOW (ref 3.8–10.8)

## 2020-02-29 LAB — COMPREHENSIVE METABOLIC PANEL
AG Ratio: 1.5 (calc) (ref 1.0–2.5)
ALT: 10 U/L (ref 6–29)
AST: 20 U/L (ref 10–30)
Albumin: 4.3 g/dL (ref 3.6–5.1)
Alkaline phosphatase (APISO): 71 U/L (ref 31–125)
BUN: 10 mg/dL (ref 7–25)
CO2: 30 mmol/L (ref 20–32)
Calcium: 10 mg/dL (ref 8.6–10.2)
Chloride: 101 mmol/L (ref 98–110)
Creat: 0.71 mg/dL (ref 0.50–1.10)
Globulin: 2.9 g/dL (calc) (ref 1.9–3.7)
Glucose, Bld: 81 mg/dL (ref 65–99)
Potassium: 4.5 mmol/L (ref 3.5–5.3)
Sodium: 140 mmol/L (ref 135–146)
Total Bilirubin: 0.3 mg/dL (ref 0.2–1.2)
Total Protein: 7.2 g/dL (ref 6.1–8.1)

## 2020-02-29 LAB — VITAMIN B12: Vitamin B-12: 413 pg/mL (ref 200–1100)

## 2020-02-29 LAB — TSH: TSH: 1.22 mIU/L

## 2020-02-29 LAB — VITAMIN D 25 HYDROXY (VIT D DEFICIENCY, FRACTURES): Vit D, 25-Hydroxy: 24 ng/mL — ABNORMAL LOW (ref 30–100)

## 2020-02-29 LAB — INSULIN, RANDOM: Insulin: 2.1 u[IU]/mL

## 2020-02-29 LAB — ANA: Anti Nuclear Antibody (ANA): NEGATIVE

## 2020-02-29 LAB — SEDIMENTATION RATE: Sed Rate: 9 mm/h (ref 0–20)

## 2020-06-01 ENCOUNTER — Encounter (INDEPENDENT_AMBULATORY_CARE_PROVIDER_SITE_OTHER): Payer: Self-pay

## 2020-06-07 ENCOUNTER — Telehealth: Payer: Self-pay | Admitting: Family Medicine

## 2020-06-07 NOTE — Telephone Encounter (Signed)
Patient is requesting a call back, patient did not say what she needed but asked for you

## 2020-07-04 ENCOUNTER — Ambulatory Visit: Payer: 59 | Admitting: Family Medicine

## 2020-07-04 ENCOUNTER — Other Ambulatory Visit: Payer: Self-pay

## 2020-07-04 VITALS — BP 112/68 | HR 68 | Temp 99.5°F | Resp 18 | Wt 186.0 lb

## 2020-07-04 DIAGNOSIS — R319 Hematuria, unspecified: Secondary | ICD-10-CM

## 2020-07-04 DIAGNOSIS — R635 Abnormal weight gain: Secondary | ICD-10-CM | POA: Diagnosis not present

## 2020-07-04 DIAGNOSIS — R1084 Generalized abdominal pain: Secondary | ICD-10-CM

## 2020-07-04 DIAGNOSIS — M546 Pain in thoracic spine: Secondary | ICD-10-CM | POA: Diagnosis not present

## 2020-07-04 DIAGNOSIS — M79662 Pain in left lower leg: Secondary | ICD-10-CM

## 2020-07-04 LAB — POCT URINALYSIS DIPSTICK
Bilirubin, UA: NEGATIVE
Glucose, UA: NEGATIVE
Ketones, UA: NEGATIVE
Leukocytes, UA: NEGATIVE
Nitrite, UA: NEGATIVE
Protein, UA: NEGATIVE
Spec Grav, UA: >=1.03 — AB (ref 1.010–1.025)
Urobilinogen, UA: 0.2 E.U./dL
pH, UA: 6 (ref 5.0–8.0)

## 2020-07-04 NOTE — Progress Notes (Signed)
Patient ID: Anna [name], female    DOB: [birth year]  Age: 40 y.o. MRN: [id number]    Subjective:  Subjective  HPI Anna [name] presents for back pain x 2 weeks -- right side  No radiation of pain ,   No known injury No urinary symptoms--- + R flank pain    Review of Systems  Constitutional: Negative for appetite change, diaphoresis, fatigue and unexpected weight change.  Eyes: Negative for pain, redness and visual disturbance.  Respiratory: Negative for cough, chest tightness, shortness of breath and wheezing.   Cardiovascular: Negative for chest pain, palpitations and leg swelling.  Endocrine: Negative for cold intolerance, heat intolerance, polydipsia, polyphagia and polyuria.  Genitourinary: Positive for flank pain. Negative for difficulty urinating, dysuria and frequency.  Musculoskeletal: Positive for back pain.  Neurological: Negative for dizziness, light-headedness, numbness and headaches.    History Past Medical History:  Diagnosis Date  . Allergy   . Anemia   . Blood transfusion without reported diagnosis    at birth  . GERD (gastroesophageal reflux disease)   . IBS (irritable bowel syndrome)   . Migraine   . PVC (premature ventricular contraction)     She has a past surgical history that includes Wisdom tooth extraction (2010); Tubal ligation (06-04-2002); Endometrial ablation; Cholecystectomy (N/A, 09/12/2015); Radioactive seed guided excisional breast biopsy (Right, 09/12/2015); and Breast excisional biopsy.   Her family history includes Arthritis in her maternal grandmother and maternal uncle; Bone cancer in her paternal grandmother; Breast cancer in her paternal grandmother; Colon cancer in her maternal grandmother and maternal uncle; Diabetes in her maternal grandfather, maternal grandmother, and paternal grandmother; Heart disease in her maternal grandfather and maternal uncle; Hypertension in her maternal grandmother; Kidney disease in her maternal  grandfather; Kidney failure in her maternal grandfather; Ovarian cancer in her maternal aunt; Prostate cancer in her maternal uncle and maternal uncle; Stroke in her maternal grandfather.She reports that she has never smoked. She has never used smokeless tobacco. She reports that she does not drink alcohol and does not use drugs.  Current Outpatient Medications on File Prior to Visit  Medication Sig Dispense Refill  . ALPRAZolam (XANAX) 0.25 MG tablet Take 1 tablet (0.25 mg total) by mouth 2 (two) times daily as needed for anxiety. 20 tablet 0  . cyanocobalamin (,VITAMIN B-12,) 1000 MCG/ML injection Inject 1 ml or 1000 mcg of Vit B12 into muscle monthly for 1 year 12 mL 1  . levocetirizine (XYZAL) 5 MG tablet Take 1 tablet (5 mg total) by mouth every evening. 90 tablet 3  . Probiotic Product (ALIGN) 4 MG CAPS Take 1 capsule by mouth every other day.    . valACYclovir (VALTREX) 1000 MG tablet Take 1 tablet (1,000 mg total) by mouth 3 (three) times daily. 30 tablet 2   No current facility-administered medications on file prior to visit.     Objective:  Objective  Physical Exam Vitals and nursing note reviewed.  Constitutional:      Appearance: She is well-developed and well-nourished.  HENT:     Head: Normocephalic and atraumatic.  Eyes:     Extraocular Movements: EOM normal.     Conjunctiva/sclera: Conjunctivae normal.  Neck:     Thyroid: No thyromegaly.     Vascular: No carotid bruit or JVD.  Cardiovascular:     Rate and Rhythm: Normal rate and regular rhythm.     Heart sounds: Normal heart sounds. No murmur heard.   Pulmonary:     Effort: Pulmonary effort  is normal. No respiratory distress.     Breath sounds: Normal breath sounds. No wheezing or rales.  Chest:     Chest wall: No tenderness.  Musculoskeletal:        General: Tenderness present. No swelling, deformity, signs of injury or edema.     Cervical back: Normal range of motion and neck supple.     Right lower leg: No  edema.     Left lower leg: Tenderness present. No edema.       Legs:  Neurological:     Mental Status: She is alert and oriented to person, place, and time.     Cranial Nerves: No cranial nerve deficit.     Sensory: No sensory deficit.     Motor: No weakness.     Coordination: Coordination normal.     Gait: Gait normal.     Deep Tendon Reflexes: Reflexes normal.  Psychiatric:        Mood and Affect: Mood and affect normal.    BP 112/68 (BP Location: Left Arm, Patient Position: Sitting, Cuff Size: Normal)   Pulse 68   Temp 99.5 F (37.5 C) (Oral)   Resp 18   Wt 186 lb (84.4 kg)   SpO2 98%   BMI 32.95 kg/m  Wt Readings from Last 3 Encounters:  07/04/20 186 lb (84.4 kg)  02/27/20 180 lb (81.6 kg)  01/17/19 172 lb (78 kg)     Lab Results  Component Value Date   WBC 3.7 (L) 02/27/2020   HGB 12.7 02/27/2020   HCT 37.5 02/27/2020   PLT 258 02/27/2020   GLUCOSE 81 02/27/2020   CHOL 144 03/16/2014   TRIG 38.0 03/16/2014   HDL 46.40 03/16/2014   LDLCALC 90 03/16/2014   ALT 10 02/27/2020   AST 20 02/27/2020   NA 140 02/27/2020   K 4.5 02/27/2020   CL 101 02/27/2020   CREATININE 0.71 02/27/2020   BUN 10 02/27/2020   CO2 30 02/27/2020   TSH 1.60 07/04/2020   HGBA1C 5.6 01/21/2016    CT Renal Stone Study  Result Date: 01/17/2019 CLINICAL DATA:  Pelvic pain and dysuria for 1 day EXAM: CT ABDOMEN AND PELVIS WITHOUT CONTRAST TECHNIQUE: Multidetector CT imaging of the abdomen and pelvis was performed following the standard protocol without IV contrast. COMPARISON:  None. FINDINGS: Lower chest: No acute abnormality. Hepatobiliary: No focal liver abnormality is seen. Status post cholecystectomy. No biliary dilatation. Pancreas: Unremarkable. No pancreatic ductal dilatation or surrounding inflammatory changes. Spleen: Normal in size without focal abnormality. Adrenals/Urinary Tract: Adrenal glands are within normal limits. Kidneys are well visualized bilaterally. No renal calculi  or obstructive changes are seen. The bladder is well distended. No focal abnormality is seen. Stomach/Bowel: The appendix is within normal limits. The large and small bowel show no significant obstructive or inflammatory changes. The stomach is within normal limits. Vascular/Lymphatic: No significant vascular findings are present. No enlarged abdominal or pelvic lymph nodes. Reproductive: Uterus is within normal limits. Small left ovarian cysts are seen. Other: No abdominal wall hernia or abnormality. No abdominopelvic ascites. Musculoskeletal: No acute or significant osseous findings. IMPRESSION: No renal or ureteral calculi are identified. No acute abnormality seen. Electronically Signed   By: Inez Catalina M.D.   On: 01/17/2019 23:13     Assessment & Plan:  Plan  I have discontinued Vicenta P. Lienau's Vitamin D (Ergocalciferol), UNABLE TO FIND, acyclovir, phenazopyridine, meloxicam, and Vitamin D (Ergocalciferol). I am also having her maintain her levocetirizine, Align, cyanocobalamin, ALPRAZolam,  and valACYclovir.  No orders of the defined types were placed in this encounter.   Problem List Items Addressed This Visit      Unprioritized   Hematuria    Strain urine  Suspect kidney stone  Consider ct scan        Relevant Orders   Urine Culture   Pain of left calf - Primary    Check [place] low ext       Relevant Orders   US Venous Img Lower Unilateral Left (DVT)   Right-sided thoracic back pain    Suspect kidney stone No pain with movement  +flank pain       Relevant Orders   POCT Urinalysis Dipstick (Completed)    Other Visit Diagnoses    Weight gain       Relevant Orders   Thyroid Panel With TSH (Completed)   Insulin, random      Follow-up: Return if symptoms worsen or fail to improve.  Ann Held, DO

## 2020-07-04 NOTE — Patient Instructions (Signed)
Acute Back Pain, Adult Acute back pain is sudden and usually short-lived. It is often caused by an injury to the muscles and tissues in the back. The injury may result from:  A muscle or ligament getting overstretched or torn (strained). Ligaments are tissues that connect bones to each other. Lifting something improperly can cause a back strain.  Wear and tear (degeneration) of the spinal disks. Spinal disks are circular tissue that provide cushioning between the bones of the spine (vertebrae).  Twisting motions, such as while playing sports or doing yard work.  A hit to the back.  Arthritis. You may have a physical exam, lab tests, and imaging tests to find the cause of your pain. Acute back pain usually goes away with rest and home care. Follow these instructions at home: Managing pain, stiffness, and swelling  Treatment may include medicines for pain and inflammation that are taken by mouth or applied to the skin, prescription pain medicine, or muscle relaxants. Take over-the-counter and prescription medicines only as told by your health care provider.  Your health care provider may recommend applying ice during the first 24-48 hours after your pain starts. To do this: ? Put ice in a plastic bag. ? Place a towel between your skin and the bag. ? Leave the ice on for 20 minutes, 2-3 times a day.  If directed, apply heat to the affected area as often as told by your health care provider. Use the heat source that your health care provider recommends, such as a moist heat pack or a heating pad. ? Place a towel between your skin and the heat source. ? Leave the heat on for 20-30 minutes. ? Remove the heat if your skin turns bright red. This is especially important if you are unable to feel pain, heat, or cold. You have a greater risk of getting burned. Activity  Do not stay in bed. Staying in bed for more than 1-2 days can delay your recovery.  Sit up and stand up straight. Avoid leaning  forward when you sit or hunching over when you stand. ? If you work at a desk, sit close to it so you do not need to lean over. Keep your chin tucked in. Keep your neck drawn back, and keep your elbows bent at a 90-degree angle (right angle). ? Sit high and close to the steering wheel when you drive. Add lower back (lumbar) support to your car seat, if needed.  Take short walks on even surfaces as soon as you are able. Try to increase the length of time you walk each day.  Do not sit, drive, or stand in one place for more than 30 minutes at a time. Sitting or standing for long periods of time can put stress on your back.  Do not drive or use heavy machinery while taking prescription pain medicine.  Use proper lifting techniques. When you bend and lift, use positions that put less stress on your back: ? Bend your knees. ? Keep the load close to your body. ? Avoid twisting.  Exercise regularly as told by your health care provider. Exercising helps your back heal faster and helps prevent back injuries by keeping muscles strong and flexible.  Work with a physical therapist to make a safe exercise program, as recommended by your health care provider. Do any exercises as told by your physical therapist.   Lifestyle  Maintain a healthy weight. Extra weight puts stress on your back and makes it difficult to have   good posture.  Avoid activities or situations that make you feel anxious or stressed. Stress and anxiety increase muscle tension and can make back pain worse. Learn ways to manage anxiety and stress, such as through exercise. General instructions  Sleep on a firm mattress in a comfortable position. Try lying on your side with your knees slightly bent. If you lie on your back, put a pillow under your knees.  Follow your treatment plan as told by your health care provider. This may include: ? Cognitive or behavioral therapy. ? Acupuncture or massage therapy. ? Meditation or yoga. Contact  a health care provider if:  You have pain that is not relieved with rest or medicine.  You have increasing pain going down into your legs or buttocks.  Your pain does not improve after 2 weeks.  You have pain at night.  You lose weight without trying.  You have a fever or chills. Get help right away if:  You develop new bowel or bladder control problems.  You have unusual weakness or numbness in your arms or legs.  You develop nausea or vomiting.  You develop abdominal pain.  You feel faint. Summary  Acute back pain is sudden and usually short-lived.  Use proper lifting techniques. When you bend and lift, use positions that put less stress on your back.  Take over-the-counter and prescription medicines and apply heat or ice as directed by your health care provider. This information is not intended to replace advice given to you by your health care provider. Make sure you discuss any questions you have with your health care provider. Document Revised: 01/12/2020 Document Reviewed: 01/12/2020 [employer] Patient Education  2021 [employer]  

## 2020-07-05 ENCOUNTER — Encounter: Payer: Self-pay | Admitting: Family Medicine

## 2020-07-05 ENCOUNTER — Other Ambulatory Visit: Payer: Self-pay | Admitting: Family Medicine

## 2020-07-05 DIAGNOSIS — M546 Pain in thoracic spine: Secondary | ICD-10-CM | POA: Insufficient documentation

## 2020-07-05 DIAGNOSIS — R319 Hematuria, unspecified: Secondary | ICD-10-CM | POA: Insufficient documentation

## 2020-07-05 DIAGNOSIS — R109 Unspecified abdominal pain: Secondary | ICD-10-CM

## 2020-07-05 LAB — THYROID PANEL WITH TSH
Free Thyroxine Index: 2.3 (ref 1.4–3.8)
T3 Uptake: 27 % (ref 22–35)
T4, Total: 8.7 ug/dL (ref 5.1–11.9)
TSH: 1.6 mIU/L

## 2020-07-05 LAB — URINE CULTURE
MICRO NUMBER:: 11603513
SPECIMEN QUALITY:: ADEQUATE

## 2020-07-05 LAB — INSULIN, RANDOM: Insulin: 3.9 u[IU]/mL

## 2020-07-05 NOTE — Assessment & Plan Note (Signed)
Suspect kidney stone No pain with movement  +flank pain

## 2020-07-05 NOTE — Assessment & Plan Note (Signed)
Strain urine  Suspect kidney stone  Consider ct scan

## 2020-07-05 NOTE — Assessment & Plan Note (Signed)
Check [place] low ext

## 2020-07-05 NOTE — Telephone Encounter (Signed)
I have put the order in for the ct Have gwen or sherri schedule for tomorrow if possible

## 2020-07-06 ENCOUNTER — Other Ambulatory Visit: Payer: Self-pay

## 2020-07-06 ENCOUNTER — Ambulatory Visit (HOSPITAL_BASED_OUTPATIENT_CLINIC_OR_DEPARTMENT_OTHER)
Admission: RE | Admit: 2020-07-06 | Discharge: 2020-07-06 | Disposition: A | Discharge status: Home / Self Care | Payer: 59 | Source: Ambulatory Visit | Attending: Family Medicine | Admitting: Family Medicine

## 2020-07-06 DIAGNOSIS — M79662 Pain in left lower leg: Secondary | ICD-10-CM | POA: Insufficient documentation

## 2020-07-08 NOTE — Addendum Note (Signed)
Addended by: Roma Schanz R on: 07/08/2020 09:56 AM   Modules accepted: Orders

## 2020-07-10 ENCOUNTER — Other Ambulatory Visit: Payer: Self-pay | Admitting: Family Medicine

## 2020-07-10 ENCOUNTER — Telehealth: Payer: Self-pay

## 2020-07-10 DIAGNOSIS — R109 Unspecified abdominal pain: Secondary | ICD-10-CM

## 2020-07-10 DIAGNOSIS — R10A0 Flank pain, unspecified side: Secondary | ICD-10-CM

## 2020-07-10 NOTE — Telephone Encounter (Signed)
Spoke with insurance company yesterday. They stated CT was not refused but still pending after receiving office notes. They were unable to give me anymore information even though the CT is still pending after 5 days.

## 2020-07-12 ENCOUNTER — Ambulatory Visit (HOSPITAL_BASED_OUTPATIENT_CLINIC_OR_DEPARTMENT_OTHER): Admission: RE | Admit: 2020-07-12 | Payer: 59 | Source: Ambulatory Visit

## 2020-07-18 ENCOUNTER — Other Ambulatory Visit (INDEPENDENT_AMBULATORY_CARE_PROVIDER_SITE_OTHER): Payer: 59

## 2020-07-18 ENCOUNTER — Other Ambulatory Visit: Payer: Self-pay

## 2020-07-18 ENCOUNTER — Other Ambulatory Visit: Payer: Self-pay | Admitting: Family Medicine

## 2020-07-18 DIAGNOSIS — R300 Dysuria: Secondary | ICD-10-CM | POA: Diagnosis not present

## 2020-07-18 LAB — POC URINALSYSI DIPSTICK (AUTOMATED)
Bilirubin, UA: NEGATIVE
Blood, UA: NEGATIVE
Glucose, UA: NEGATIVE
Ketones, UA: NEGATIVE
Leukocytes, UA: NEGATIVE
Nitrite, UA: NEGATIVE
Protein, UA: NEGATIVE
Spec Grav, UA: 1.025 (ref 1.010–1.025)
Urobilinogen, UA: 0.2 E.U./dL
pH, UA: 6 (ref 5.0–8.0)

## 2020-07-19 LAB — URINE CULTURE
MICRO NUMBER:: 11659569
SPECIMEN QUALITY:: ADEQUATE

## 2020-08-07 ENCOUNTER — Ambulatory Visit (INDEPENDENT_AMBULATORY_CARE_PROVIDER_SITE_OTHER): Payer: 59 | Admitting: Family Medicine

## 2020-08-07 ENCOUNTER — Other Ambulatory Visit: Payer: Self-pay

## 2020-08-07 ENCOUNTER — Encounter (INDEPENDENT_AMBULATORY_CARE_PROVIDER_SITE_OTHER): Payer: Self-pay | Admitting: Family Medicine

## 2020-08-07 VITALS — BP 102/69 | HR 74 | Temp 97.9°F | Ht 63.0 in | Wt 181.0 lb

## 2020-08-07 DIAGNOSIS — D249 Benign neoplasm of unspecified breast: Secondary | ICD-10-CM | POA: Insufficient documentation

## 2020-08-07 DIAGNOSIS — R5383 Other fatigue: Secondary | ICD-10-CM | POA: Diagnosis not present

## 2020-08-07 DIAGNOSIS — Z9189 Other specified personal risk factors, not elsewhere classified: Secondary | ICD-10-CM

## 2020-08-07 DIAGNOSIS — R7303 Prediabetes: Secondary | ICD-10-CM

## 2020-08-07 DIAGNOSIS — Z1331 Encounter for screening for depression: Secondary | ICD-10-CM | POA: Diagnosis not present

## 2020-08-07 DIAGNOSIS — B009 Herpesviral infection, unspecified: Secondary | ICD-10-CM | POA: Insufficient documentation

## 2020-08-07 DIAGNOSIS — Z6832 Body mass index (BMI) 32.0-32.9, adult: Secondary | ICD-10-CM

## 2020-08-07 DIAGNOSIS — E559 Vitamin D deficiency, unspecified: Secondary | ICD-10-CM | POA: Diagnosis not present

## 2020-08-07 DIAGNOSIS — K219 Gastro-esophageal reflux disease without esophagitis: Secondary | ICD-10-CM | POA: Insufficient documentation

## 2020-08-07 DIAGNOSIS — R0602 Shortness of breath: Secondary | ICD-10-CM

## 2020-08-07 DIAGNOSIS — G43909 Migraine, unspecified, not intractable, without status migrainosus: Secondary | ICD-10-CM | POA: Insufficient documentation

## 2020-08-07 DIAGNOSIS — E669 Obesity, unspecified: Secondary | ICD-10-CM

## 2020-08-07 DIAGNOSIS — Z0289 Encounter for other administrative examinations: Secondary | ICD-10-CM

## 2020-08-07 DIAGNOSIS — E538 Deficiency of other specified B group vitamins: Secondary | ICD-10-CM

## 2020-08-08 LAB — CBC WITH DIFFERENTIAL
Basophils Absolute: 0 10*3/uL (ref 0.0–0.2)
Basos: 1 %
EOS (ABSOLUTE): 0.1 10*3/uL (ref 0.0–0.4)
Eos: 4 %
Hematocrit: 40.7 % (ref 34.0–46.6)
Hemoglobin: 13.6 g/dL (ref 11.1–15.9)
Immature Grans (Abs): 0 10*3/uL (ref 0.0–0.1)
Immature Granulocytes: 0 %
Lymphocytes Absolute: 1.4 10*3/uL (ref 0.7–3.1)
Lymphs: 44 %
MCH: 28.5 pg (ref 26.6–33.0)
MCHC: 33.4 g/dL (ref 31.5–35.7)
MCV: 85 fL (ref 79–97)
Monocytes Absolute: 0.2 10*3/uL (ref 0.1–0.9)
Monocytes: 6 %
Neutrophils Absolute: 1.4 10*3/uL (ref 1.4–7.0)
Neutrophils: 45 %
RBC: 4.78 x10E6/uL (ref 3.77–5.28)
RDW: 12.8 % (ref 11.7–15.4)
WBC: 3.1 10*3/uL — ABNORMAL LOW (ref 3.4–10.8)

## 2020-08-08 LAB — COMPREHENSIVE METABOLIC PANEL
ALT: 11 IU/L (ref 0–32)
AST: 21 IU/L (ref 0–40)
Albumin/Globulin Ratio: 1.6 (ref 1.2–2.2)
Albumin: 4.6 g/dL (ref 3.8–4.8)
Alkaline Phosphatase: 82 IU/L (ref 44–121)
BUN/Creatinine Ratio: 20 (ref 9–23)
BUN: 12 mg/dL (ref 6–24)
Bilirubin Total: 0.3 mg/dL (ref 0.0–1.2)
CO2: 22 mmol/L (ref 20–29)
Calcium: 10.1 mg/dL (ref 8.7–10.2)
Chloride: 97 mmol/L (ref 96–106)
Creatinine, Ser: 0.61 mg/dL (ref 0.57–1.00)
Globulin, Total: 2.8 g/dL (ref 1.5–4.5)
Glucose: 93 mg/dL (ref 65–99)
Potassium: 4.5 mmol/L (ref 3.5–5.2)
Sodium: 140 mmol/L (ref 134–144)
Total Protein: 7.4 g/dL (ref 6.0–8.5)
eGFR: 115 mL/min/{1.73_m2} (ref >59)

## 2020-08-08 LAB — VITAMIN B12: Vitamin B-12: 299 pg/mL (ref 232–1245)

## 2020-08-08 LAB — LIPID PANEL WITH LDL/HDL RATIO
Cholesterol, Total: 152 mg/dL (ref 100–199)
HDL: 65 mg/dL (ref >39)
LDL Chol Calc (NIH): 74 mg/dL (ref 0–99)
LDL/HDL Ratio: 1.1 ratio (ref 0.0–3.2)
Triglycerides: 67 mg/dL (ref 0–149)
VLDL Cholesterol Cal: 13 mg/dL (ref 5–40)

## 2020-08-08 LAB — T4: T4, Total: 9.2 ug/dL (ref 4.5–12.0)

## 2020-08-08 LAB — VITAMIN D 25 HYDROXY (VIT D DEFICIENCY, FRACTURES): Vit D, 25-Hydroxy: 23.6 ng/mL — ABNORMAL LOW (ref 30.0–100.0)

## 2020-08-08 LAB — HEMOGLOBIN A1C
Est. average glucose Bld gHb Est-mCnc: 126 mg/dL
Hgb A1c MFr Bld: 6 % — ABNORMAL HIGH (ref 4.8–5.6)

## 2020-08-08 LAB — T3: T3, Total: 156 ng/dL (ref 71–180)

## 2020-08-08 LAB — FOLATE: Folate: 10.2 ng/mL (ref >3.0)

## 2020-08-08 LAB — TSH: TSH: 1.13 u[IU]/mL (ref 0.450–4.500)

## 2020-08-13 NOTE — Progress Notes (Signed)
Dear Dr. Carollee Herter,   Thank you for referring Anna [name] to our clinic. The following note includes my evaluation and treatment recommendations.  Chief Complaint:   OBESITY Anna [name] (MR# [id number]) is a 40 y.o. female who presents for evaluation and treatment of obesity and related comorbidities. Current BMI is Body mass index is 32.06 kg/m. Anna [name] has been struggling with her weight for many years and has been unsuccessful in either losing weight, maintaining weight loss, or reaching her healthy weight goal.  Anna [name] is currently in the action stage of change and ready to dedicate time achieving and maintaining a healthier weight. Anna [name] is interested in becoming our patient and working on intensive lifestyle modifications including (but not limited to) diet and exercise for weight loss.  Anna [name] was referred by Dr. Carollee Herter. She has gained 50 lbs in 2 years. She is a Engineer, building services at [employer]. Eating out 4 times a week. Often skipping 1 meal- either breakfast or lunch. Boiled egg with 3-4 strips of [place] bacon. Coffee with hazelnut creamer + 1/2 premier protein shake (satisfied); 2PM tuna packets; may have snack of graham crackers before lunch; 8-8:30PM- 1 cup shrimp or 2 oz salmon, 1 cup rice, 1 cup broccoli or spinach; before bed snack- small bag of chips, nutty bars, or cookies n cream ice cream.  Anna [name]'s habits were reviewed today and are as follows: Her family eats meals together, she thinks her family will eat healthier with her, her desired weight loss is 41 lbs, she started gaining weight 2 years ago, her heaviest weight ever was 180 pounds, she is a picky eater and doesn't like to eat healthier foods, she snacks frequently in the evenings, she skips meals frequently, she is frequently drinking liquids with calories and she frequently makes poor food choices.  Depression Screen Anna [name]'s Food and Mood (modified PHQ-9) score was  8.  Depression screen Anna [name] 2/9 08/07/2020  Decreased Interest 2  Down, Depressed, Hopeless 0  PHQ - 2 Score 2  Altered sleeping 3  Tired, decreased energy 3  Change in appetite 0  Feeling bad or failure about yourself  0  Trouble concentrating 0  Moving slowly or fidgety/restless 0  Suicidal thoughts 0  PHQ-9 Score 8  Difficult doing work/chores Not difficult at all   Subjective:   1. Other fatigue Anna [name] admits to daytime somnolence and admits to waking up still tired. Patent has a history of symptoms of morning headache. Anna [name] generally gets 7 or 8 hours of sleep per night, and states that she has poor sleep quality. Snoring is present. Apneic episodes are present. Epworth Sleepiness Score is 6. EKG Abnormal T-wave inversion V1-V3 (same as in 2020).  2. Shortness of breath Anna [name] notes increasing shortness of breath with exercising and seems to be worsening over time with weight gain. She notes getting out of breath sooner with activity than she used to. This has gotten worse recently. Cliffie denies shortness of breath at rest or orthopnea. EKG Abnormal T-wave inversion V1-V3 (same as in 2020).  3. Pre-diabetes Anna [name]'s last A1c was 5.6 in 2017 and insulin level <5. She is not on medication.  4. Vitamin D deficiency Anna [name]'s last Vit D level was 24 in October 2021. She is on OTC Vit D 1,000 IU daily.   5. Vitamin B 12 deficiency Anna [name]'s last B12 level was 413 on 02/27/2020. She reports fatigue.  6. At risk for diabetes mellitus Anna [name] is at higher than average  risk for developing diabetes due to obesity.   Assessment/Plan:   1. Other fatigue Anna [name] does feel that her weight is causing her energy to be lower than it should be. Fatigue may be related to obesity, depression or many other causes. Labs will be ordered, and in the meanwhile, Anna [name] will focus on self care including making healthy food choices, increasing physical activity and focusing on stress  reduction. Check labs today.  - EKG 12-Lead - TSH - Folate - T3 - T4 - CBC With Differential  2. Shortness of breath Anna [name] does feel that she gets out of breath more easily that she used to when she exercises. Anna [name]'s shortness of breath appears to be obesity related and exercise induced. She has agreed to work on weight loss and gradually increase exercise to treat her exercise induced shortness of breath. Will continue to monitor closely. Check labs today.  - Lipid Panel With LDL/HDL Ratio  3. Pre-diabetes Anna [name] will continue to work on weight loss, exercise, and decreasing simple carbohydrates to help decrease the risk of diabetes. Check labs today.  - Comprehensive metabolic panel - Hemoglobin A1c  4. Vitamin D deficiency Low Vitamin D level contributes to fatigue and are associated with obesity, breast, and colon cancer. She agrees to continue to take OTC Vitamin D @1 ,000 IU daily and will follow-up for routine testing of Vitamin D, at least 2-3 times per year to avoid over-replacement. Check labs today.  - VITAMIN D 25 Hydroxy (Vit-D Deficiency, Fractures)  5. Vitamin B 12 deficiency The diagnosis was reviewed with the patient. Counseling provided today, see below. We will continue to monitor. Orders and follow up as documented in patient record. Check labs today.  Counseling . The body needs vitamin B12: to make red blood cells; to make DNA; and to help the nerves work properly so they can carry messages from the brain to the body.  . The main causes of vitamin B12 deficiency include dietary deficiency, digestive diseases, pernicious anemia, and having a surgery in which part of the stomach or small intestine is removed.  . Certain medicines can make it harder for the body to absorb vitamin B12. These medicines include: heartburn medications; some antibiotics; some medications used to treat diabetes, gout, and high cholesterol.  . In some cases, there are no symptoms  of this condition. If the condition leads to anemia or nerve damage, various symptoms can occur, such as weakness or fatigue, shortness of breath, and numbness or tingling in your hands and feet.   . Treatment:  o May include taking vitamin B12 supplements.  o Avoid alcohol.  o Eat lots of healthy foods that contain vitamin B12: - Beef, pork, chicken, [place], and organ meats, such as liver.  - Seafood: This includes clams, rainbow trout, salmon, tuna, and haddock. Eggs.  - Cereal and dairy products that are fortified: This means that vitamin B12 has been added to the food.  - Vitamin B12  6. Screening for depression Quenesha had a positive depression screening. Depression is commonly associated with obesity and often results in emotional eating behaviors. We will monitor this closely and work on CBT to help improve the non-hunger eating patterns. Referral to Psychology may be required if no improvement is seen as she continues in our clinic.  7. At risk for diabetes mellitus Trinady was given approximately 15 minutes of diabetes education and counseling today. We discussed intensive lifestyle modifications today with an emphasis on weight loss as well as increasing exercise  and decreasing simple carbohydrates in her diet. We also reviewed medication options with an emphasis on risk versus benefit of those discussed.   Repetitive spaced learning was employed today to elicit superior memory formation and behavioral change.  8. Class 1 obesity with serious comorbidity and body mass index (BMI) of 32.0 to 32.9 in adult, unspecified obesity type Anaalicia is currently in the action stage of change and her goal is to continue with weight loss efforts. I recommend Ilse begin the structured treatment plan as follows:  She has agreed to the [employer].  Exercise goals: No exercise has been prescribed at this time.   Behavioral modification strategies: increasing lean protein intake, meal  planning and cooking strategies and keeping healthy foods in the home.  She was informed of the importance of frequent follow-up visits to maximize her success with intensive lifestyle modifications for her multiple health conditions. She was informed we would discuss her lab results at her next visit unless there is a critical issue that needs to be addressed sooner. Bobie agreed to keep her next visit at the agreed upon time to discuss these results.  Objective:   Blood pressure 102/69, pulse 74, temperature 97.9 F (36.6 C), height 5\' 3"  (1.6 m), weight 181 lb (82.1 kg), SpO2 96 %. Body mass index is 32.06 kg/m.  EKG: Abnormal T-wave inversion V1-V3 (same as in 2020)  Indirect Calorimeter completed today shows a VO2 of 192 and a REE of 1336.  Her calculated basal metabolic rate is 3536 thus her basal metabolic rate is worse than expected.  General: Cooperative, alert, well developed, in no acute distress. HEENT: Conjunctivae and lids unremarkable. Cardiovascular: Regular rhythm.  Lungs: Normal work of breathing. Neurologic: No focal deficits.   Lab Results  Component Value Date   CREATININE 0.61 08/07/2020   BUN 12 08/07/2020   NA 140 08/07/2020   K 4.5 08/07/2020   CL 97 08/07/2020   CO2 22 08/07/2020   Lab Results  Component Value Date   ALT 11 08/07/2020   AST 21 08/07/2020   ALKPHOS 82 08/07/2020   BILITOT 0.3 08/07/2020   Lab Results  Component Value Date   HGBA1C 6.0 (H) 08/07/2020   HGBA1C 5.6 01/21/2016   HGBA1C 5.7 03/16/2014   HGBA1C 5.8 12/06/2013   HGBA1C 5.5 08/31/2013   No results found for: INSULIN Lab Results  Component Value Date   TSH 1.130 08/07/2020   Lab Results  Component Value Date   CHOL 152 08/07/2020   HDL 65 08/07/2020   LDLCALC 74 08/07/2020   TRIG 67 08/07/2020   CHOLHDL 3 03/16/2014   Lab Results  Component Value Date   WBC 3.1 (L) 08/07/2020   HGB 13.6 08/07/2020   HCT 40.7 08/07/2020   MCV 85 08/07/2020   PLT 258  02/27/2020   Lab Results  Component Value Date   IRON 94 12/03/2017   FERRITIN 127.4 12/03/2017   Attestation Statements:   Reviewed by clinician on day of visit: allergies, medications, problem list, medical history, surgical history, family history, social history, and previous encounter notes.  Coral Ceo, am acting as transcriptionist for Coralie Common, MD.   This is the patient's first visit at [facility]. The patient's NEW PATIENT PACKET was reviewed at length. Included in the packet: current and past health history, medications, allergies, ROS, gynecologic history (women only), surgical history, family history, social history, weight history, weight loss surgery history (for those that have had weight loss  surgery), nutritional evaluation, mood and food questionnaire, PHQ9, Epworth questionnaire, sleep habits questionnaire, patient life and health improvement goals questionnaire. These will all be scanned into the patient's chart under media.   During the visit, I independently reviewed the patient's EKG, bioimpedance scale results, and indirect calorimeter results. I used this information to tailor a meal plan for the patient that will help her to lose weight and will improve her obesity-related conditions going forward. I performed a medically necessary appropriate examination and/or evaluation. I discussed the assessment and treatment plan with the patient. The patient was provided an opportunity to ask questions and all were answered. The patient agreed with the plan and demonstrated an understanding of the instructions. Labs were ordered at this visit and will be reviewed at the next visit unless more critical results need to be addressed immediately. Clinical information was updated and documented in the EMR.   Time spent on visit including pre-visit chart review and post-visit care was 45 minutes.   A separate 15 minutes was spent on risk counseling (see  above).  I have reviewed the above documentation for accuracy and completeness, and I agree with the above. - Jinny Blossom, MD

## 2020-08-21 ENCOUNTER — Encounter (INDEPENDENT_AMBULATORY_CARE_PROVIDER_SITE_OTHER): Payer: Self-pay | Admitting: Family Medicine

## 2020-08-21 ENCOUNTER — Other Ambulatory Visit: Payer: Self-pay

## 2020-08-21 ENCOUNTER — Ambulatory Visit (INDEPENDENT_AMBULATORY_CARE_PROVIDER_SITE_OTHER): Payer: 59 | Admitting: Family Medicine

## 2020-08-21 VITALS — BP 109/67 | HR 63 | Temp 97.9°F | Ht 63.0 in | Wt 177.0 lb

## 2020-08-21 DIAGNOSIS — E669 Obesity, unspecified: Secondary | ICD-10-CM | POA: Diagnosis not present

## 2020-08-21 DIAGNOSIS — R7303 Prediabetes: Secondary | ICD-10-CM

## 2020-08-21 DIAGNOSIS — Z9189 Other specified personal risk factors, not elsewhere classified: Secondary | ICD-10-CM | POA: Diagnosis not present

## 2020-08-21 DIAGNOSIS — Z6832 Body mass index (BMI) 32.0-32.9, adult: Secondary | ICD-10-CM

## 2020-08-21 DIAGNOSIS — E559 Vitamin D deficiency, unspecified: Secondary | ICD-10-CM

## 2020-08-21 MED ORDER — VITAMIN D (ERGOCALCIFEROL) 1.25 MG (50000 UNIT) PO CAPS: 50000.0000 [IU] | ORAL_CAPSULE | ORAL | 0 refills | Status: DC

## 2020-08-23 NOTE — Progress Notes (Signed)
Chief Complaint:   OBESITY Anna [name] is here to discuss her progress with her obesity treatment plan along with follow-up of her obesity related diagnoses. Anna [name] is on the [employer] and states she is following her eating plan approximately 100% of the time. Anna [name] states she is not currently exercising.  Today's visit was #: 2 Starting weight: 181 lbs Starting date: 08/07/2020 Today's weight: 177 lbs Today's date: 08/21/2020 Total lbs lost to date: 4 Total lbs lost since last in-office visit: 4  Interim History: Last 2 weeks have been great. Anna [name] is breathing better and easier. She is getting hungry over the last few days. Choosing first dinner option frequently with 8 oz of protein. She hasn't been able to do pasta option. She has no plans coming up over the next few weeks. She is driving to [place] next weekend.  Subjective:   1. Vitamin D deficiency New. Discussed labs with patient today. Pt denies nausea, vomiting, and muscle weakness but notes fatigue. Pt is on [place] with D.  2. Pre-diabetes Discussed labs with patient today. Anna [name]'s A1c is 6.0. She has been pre-diabetic for a while. This is the highest her A1c has been.  Lab Results  Component Value Date   HGBA1C 6.0 (H) 08/07/2020   No results found for: INSULIN  3. At risk for osteoporosis Anna [name] is at higher risk of osteopenia and osteoporosis due to Vitamin D deficiency.   Assessment/Plan:   1. Vitamin D deficiency Low Vitamin D level contributes to fatigue and are associated with obesity, breast, and colon cancer. She agrees to start to take prescription Vitamin D @50 ,000 IU every week and will follow-up for routine testing of Vitamin D, at least 2-3 times per year to avoid over-replacement.  - Vitamin D, Ergocalciferol, (DRISDOL) 1.25 MG (50000 UNIT) CAPS capsule; Take 1 capsule (50,000 Units total) by mouth every 7 (seven) days.  Dispense: 4 capsule; Refill: 0  2. Pre-diabetes Anna [name] will  continue to work on weight loss, exercise, and decreasing simple carbohydrates to help decrease the risk of diabetes. Pt would like to avoid medication at this time. Follow up labs in 3 months.  3. At risk for osteoporosis Anna [name] was given approximately 30 minutes of osteoporosis prevention counseling today. Anna [name] is at risk for osteopenia and osteoporosis due to her Vitamin D deficiency. She was encouraged to take her Vitamin D and follow her higher calcium diet and increase strengthening exercise to help strengthen her bones and decrease her risk of osteopenia and osteoporosis.  Repetitive spaced learning was employed today to elicit superior memory formation and behavioral change.  4. Class 1 obesity with serious comorbidity and body mass index (BMI) of 32.0 to 32.9 in adult, unspecified obesity type Anna [name] is currently in the action stage of change. As such, her goal is to continue with weight loss efforts. She has agreed to keeping a food journal and adhering to recommended goals of 1100-1200 calories and 80+ grams protein and the Pescatarian Plan + 100 grams.   Exercise goals: As is  Behavioral modification strategies: increasing lean protein intake, meal planning and cooking strategies, keeping healthy foods in the home and travel eating strategies.  Anna [name] has agreed to follow-up with our clinic in 3 weeks. She was informed of the importance of frequent follow-up visits to maximize her success with intensive lifestyle modifications for her multiple health conditions.   Objective:   Blood pressure 109/67, pulse 63, temperature 97.9 F (36.6 C), height 5\' 3"  (1.6 m),  weight 177 lb (80.3 kg), SpO2 97 %. Body mass index is 31.35 kg/m.  General: Cooperative, alert, well developed, in no acute distress. HEENT: Conjunctivae and lids unremarkable. Cardiovascular: Regular rhythm.  Lungs: Normal work of breathing. Neurologic: No focal deficits.   Lab Results  Component Value Date    CREATININE 0.61 08/07/2020   BUN 12 08/07/2020   NA 140 08/07/2020   K 4.5 08/07/2020   CL 97 08/07/2020   CO2 22 08/07/2020   Lab Results  Component Value Date   ALT 11 08/07/2020   AST 21 08/07/2020   ALKPHOS 82 08/07/2020   BILITOT 0.3 08/07/2020   Lab Results  Component Value Date   HGBA1C 6.0 (H) 08/07/2020   HGBA1C 5.6 01/21/2016   HGBA1C 5.7 03/16/2014   HGBA1C 5.8 12/06/2013   HGBA1C 5.5 08/31/2013   No results found for: INSULIN Lab Results  Component Value Date   TSH 1.130 08/07/2020   Lab Results  Component Value Date   CHOL 152 08/07/2020   HDL 65 08/07/2020   LDLCALC 74 08/07/2020   TRIG 67 08/07/2020   CHOLHDL 3 03/16/2014   Lab Results  Component Value Date   WBC 3.1 (L) 08/07/2020   HGB 13.6 08/07/2020   HCT 40.7 08/07/2020   MCV 85 08/07/2020   PLT 258 02/27/2020   Lab Results  Component Value Date   IRON 94 12/03/2017   FERRITIN 127.4 12/03/2017    Attestation Statements:   Reviewed by clinician on day of visit: allergies, medications, problem list, medical history, surgical history, family history, social history, and previous encounter notes.  Coral Ceo, am acting as transcriptionist for Coralie Common, MD.   I have reviewed the above documentation for accuracy and completeness, and I agree with the above. - Jinny Blossom, MD

## 2020-09-18 ENCOUNTER — Other Ambulatory Visit: Payer: Self-pay

## 2020-09-18 ENCOUNTER — Encounter (INDEPENDENT_AMBULATORY_CARE_PROVIDER_SITE_OTHER): Payer: Self-pay | Admitting: Family Medicine

## 2020-09-18 ENCOUNTER — Ambulatory Visit (INDEPENDENT_AMBULATORY_CARE_PROVIDER_SITE_OTHER): Payer: 59 | Admitting: Family Medicine

## 2020-09-18 VITALS — BP 103/67 | HR 60 | Temp 97.8°F | Ht 63.0 in | Wt 175.0 lb

## 2020-09-18 DIAGNOSIS — R7303 Prediabetes: Secondary | ICD-10-CM

## 2020-09-18 DIAGNOSIS — E559 Vitamin D deficiency, unspecified: Secondary | ICD-10-CM

## 2020-09-18 DIAGNOSIS — Z6832 Body mass index (BMI) 32.0-32.9, adult: Secondary | ICD-10-CM

## 2020-09-18 DIAGNOSIS — E669 Obesity, unspecified: Secondary | ICD-10-CM

## 2020-09-19 NOTE — Progress Notes (Signed)
Chief Complaint:   OBESITY Anna [name] is here to discuss her progress with her obesity treatment plan along with follow-up of her obesity related diagnoses. Anna [name] is on keeping a food journal and adhering to recommended goals of 1100-1200 calories and 80 g protein and the [place] and states she is following her eating plan approximately 100% of the time. [name] states she is weight training 30 minutes 3 times per week.  Today's visit was #: 3 Starting weight: 181 lbs Starting date: 08/07/2020 Today's weight: 175 lbs Today's date: 09/18/2020 Total lbs lost to date: 6 Total lbs lost since last in-office visit: 2  Interim History: [name] voices she has been doing really well- the scale may not move but her clothes are feeling better. She is feeling really good- more energy and feeling well rested. Pt voices her husband and  Her are going to the beach for [name]. She is still enjoying meal plan.  Subjective:   1. Pre-diabetes Joss's last A1c was 6.0. She is not on medication.  2. Vitamin D deficiency Nery denies nausea, vomiting, and muscle weakness but notes fatigue. Pt is on prescription Vit D.  Assessment/Plan:   1. Pre-diabetes [name] will continue to work on weight loss, exercise, and decreasing simple carbohydrates to help decrease the risk of diabetes. Continue pescatarian or journaling plan. Repeat labs in 2 months.  2. Vitamin D deficiency Low Vitamin D level contributes to fatigue and are associated with obesity, breast, and colon cancer. She agrees to continue to take prescription Vitamin D @50 ,000 IU every week and will follow-up for routine testing of Vitamin D, at least 2-3 times per year to avoid over-replacement.  3. Class 1 obesity with serious comorbidity and body mass index (BMI) of 32.0 to 32.9 in adult, unspecified obesity type [name] is currently in the action stage of change. As such, her goal is to continue with weight loss  efforts. She has agreed to keeping a food journal and adhering to recommended goals of 1100-1200 calories and 80+ g protein and the [place].   Exercise goals: All adults should avoid inactivity. Some physical activity is better than none, and adults who participate in any amount of physical activity gain some health benefits.  Behavioral modification strategies: increasing lean protein intake, meal planning and cooking strategies, keeping healthy foods in the home and planning for success.  [name] has agreed to follow-up with our clinic in 3 weeks. She was informed of the importance of frequent follow-up visits to maximize her success with intensive lifestyle modifications for her multiple health conditions.   Objective:   Blood pressure 103/67, pulse 60, temperature 97.8 F (36.6 C), height 5\' 3"  (1.6 m), weight 175 lb (79.4 kg), SpO2 96 %. Body mass index is 31 kg/m.  General: Cooperative, alert, well developed, in no acute distress. HEENT: Conjunctivae and lids unremarkable. Cardiovascular: Regular rhythm.  Lungs: Normal work of breathing. Neurologic: No focal deficits.   Lab Results  Component Value Date   CREATININE 0.61 08/07/2020   BUN 12 08/07/2020   NA 140 08/07/2020   K 4.5 08/07/2020   CL 97 08/07/2020   CO2 22 08/07/2020   Lab Results  Component Value Date   ALT 11 08/07/2020   AST 21 08/07/2020   ALKPHOS 82 08/07/2020   BILITOT 0.3 08/07/2020   Lab Results  Component Value Date   HGBA1C 6.0 (H) 08/07/2020   HGBA1C 5.6 01/21/2016   HGBA1C 5.7 03/16/2014   HGBA1C 5.8  12/06/2013   HGBA1C 5.5 08/31/2013   No results found for: INSULIN Lab Results  Component Value Date   TSH 1.130 08/07/2020   Lab Results  Component Value Date   CHOL 152 08/07/2020   HDL 65 08/07/2020   LDLCALC 74 08/07/2020   TRIG 67 08/07/2020   CHOLHDL 3 03/16/2014   Lab Results  Component Value Date   WBC 3.1 (L) 08/07/2020   HGB 13.6 08/07/2020   HCT 40.7  08/07/2020   MCV 85 08/07/2020   PLT 258 02/27/2020   Lab Results  Component Value Date   IRON 94 12/03/2017   FERRITIN 127.4 12/03/2017     Attestation Statements:   Reviewed by clinician on [name] of visit: allergies, medications, problem list, medical history, surgical history, family history, social history, and previous encounter notes.  Time spent on visit including pre-visit chart review and post-visit care and charting was 15 minutes.   Coral Ceo, CMA, am acting as transcriptionist for Coralie Common, MD.   I have reviewed the above documentation for accuracy and completeness, and I agree with the above. - Jinny Blossom, MD

## 2020-10-01 ENCOUNTER — Encounter: Payer: Self-pay | Admitting: Family Medicine

## 2020-10-01 DIAGNOSIS — B001 Herpesviral vesicular dermatitis: Secondary | ICD-10-CM

## 2020-10-01 MED ORDER — VALACYCLOVIR HCL 1 G PO TABS: 1000.0000 mg | ORAL_TABLET | Freq: Three times a day (TID) | ORAL | 2 refills | Status: DC

## 2020-10-14 ENCOUNTER — Ambulatory Visit (INDEPENDENT_AMBULATORY_CARE_PROVIDER_SITE_OTHER): Payer: 59 | Admitting: Bariatrics

## 2020-10-14 ENCOUNTER — Encounter (INDEPENDENT_AMBULATORY_CARE_PROVIDER_SITE_OTHER): Payer: Self-pay | Admitting: Bariatrics

## 2020-10-14 ENCOUNTER — Other Ambulatory Visit: Payer: Self-pay

## 2020-10-14 VITALS — BP 110/68 | HR 68 | Temp 98.5°F | Ht 63.0 in | Wt 172.0 lb

## 2020-10-14 DIAGNOSIS — Z6832 Body mass index (BMI) 32.0-32.9, adult: Secondary | ICD-10-CM

## 2020-10-14 DIAGNOSIS — R7303 Prediabetes: Secondary | ICD-10-CM | POA: Diagnosis not present

## 2020-10-14 DIAGNOSIS — E669 Obesity, unspecified: Secondary | ICD-10-CM

## 2020-10-14 DIAGNOSIS — E559 Vitamin D deficiency, unspecified: Secondary | ICD-10-CM | POA: Diagnosis not present

## 2020-10-17 NOTE — Progress Notes (Signed)
Chief Complaint:   OBESITY Anna [name] is here to discuss her progress with her obesity treatment plan along with follow-up of her obesity related diagnoses. Anna [name] is on the [employer] and states she is following her eating plan approximately 50% of the time. [name] states she is doing cardio and weight training 30 minutes 5-7 times per week.  Today's visit was #: 4 Starting weight: 181 lbs Starting date: 08/07/2020 Today's weight: 172 lbs Today's date: 10/14/2020 Total lbs lost to date: 9 Total lbs lost since last in-office visit: 3  Interim History: Anna [name] is down an additional 3 lbs since her last visit. She has been on vacation. She is doing ok with her water.  Subjective:   1. Pre-diabetes Anna [name] is not taking any medications.  Lab Results  Component Value Date   HGBA1C 6.0 (H) 08/07/2020   No results found for: INSULIN  2. Vitamin D deficiency Anna [name] is taking prescription Vit D weekly.  Assessment/Plan:   1. Pre-diabetes Anna [name] will continue to work on weight loss, exercise, and decreasing simple carbohydrates to help decrease the risk of diabetes. Increase healthy fats and protein.  2. Vitamin D deficiency Low Vitamin D level contributes to fatigue and are associated with obesity, breast, and colon cancer. She agrees to continue to take prescription Vitamin D @50 ,000 IU every week and will follow-up for routine testing of Vitamin D, at least 2-3 times per year to avoid over-replacement.  3. Obesity, current BMI 30  Anna [name] is currently in the action stage of change. As such, her goal is to continue with weight loss efforts. She has agreed to the Category 2 Plan and the [place]- alternate between the 2.   Meal plan Intentional eating  Exercise goals:  Increase cardio, weight training, and walking.  Behavioral modification strategies: increasing lean protein intake, decreasing simple carbohydrates, increasing vegetables, increasing  water intake, decreasing eating out, no skipping meals, meal planning and cooking strategies, keeping healthy foods in the home, and planning for success.  Anna [name] has agreed to follow-up with our clinic in 2 weeks with Dr. Jearld Shines. She was informed of the importance of frequent follow-up visits to maximize her success with intensive lifestyle modifications for her multiple health conditions.   Objective:   Blood pressure 110/68, pulse 68, temperature 98.5 F (36.9 C), height 5\' 3"  (1.6 m), weight 172 lb (78 kg), SpO2 97 %. Body mass index is 30.47 kg/m.  General: Cooperative, alert, well developed, in no acute distress. HEENT: Conjunctivae and lids unremarkable. Cardiovascular: Regular rhythm.  Lungs: Normal work of breathing. Neurologic: No focal deficits.   Lab Results  Component Value Date   CREATININE 0.61 08/07/2020   BUN 12 08/07/2020   NA 140 08/07/2020   K 4.5 08/07/2020   CL 97 08/07/2020   CO2 22 08/07/2020   Lab Results  Component Value Date   ALT 11 08/07/2020   AST 21 08/07/2020   ALKPHOS 82 08/07/2020   BILITOT 0.3 08/07/2020   Lab Results  Component Value Date   HGBA1C 6.0 (H) 08/07/2020   HGBA1C 5.6 01/21/2016   HGBA1C 5.7 03/16/2014   HGBA1C 5.8 12/06/2013   HGBA1C 5.5 08/31/2013   No results found for: INSULIN Lab Results  Component Value Date   TSH 1.130 08/07/2020   Lab Results  Component Value Date   CHOL 152 08/07/2020   HDL 65 08/07/2020   LDLCALC 74 08/07/2020   TRIG 67 08/07/2020   CHOLHDL 3 03/16/2014   Lab  Results  Component Value Date   WBC 3.1 (L) 08/07/2020   HGB 13.6 08/07/2020   HCT 40.7 08/07/2020   MCV 85 08/07/2020   PLT 258 02/27/2020   Lab Results  Component Value Date   IRON 94 12/03/2017   FERRITIN 127.4 12/03/2017    Attestation Statements:   Reviewed by clinician on day of visit: allergies, medications, problem list, medical history, surgical history, family history, social history, and previous encounter  notes.  Time spent on visit including pre-visit chart review and post-visit care and charting was 20 minutes.   Coral Ceo, CMA, am acting as Location manager for [employer], DO.  I have reviewed the above documentation for accuracy and completeness, and I agree with the above. Jearld Lesch, DO

## 2020-10-24 ENCOUNTER — Encounter (INDEPENDENT_AMBULATORY_CARE_PROVIDER_SITE_OTHER): Payer: Self-pay | Admitting: Bariatrics

## 2020-10-30 ENCOUNTER — Ambulatory Visit (INDEPENDENT_AMBULATORY_CARE_PROVIDER_SITE_OTHER): Payer: 59 | Admitting: Family Medicine

## 2020-10-30 ENCOUNTER — Other Ambulatory Visit: Payer: Self-pay

## 2020-10-30 ENCOUNTER — Encounter (INDEPENDENT_AMBULATORY_CARE_PROVIDER_SITE_OTHER): Payer: Self-pay | Admitting: Family Medicine

## 2020-10-30 VITALS — BP 106/70 | HR 69 | Temp 98.3°F | Ht 63.0 in | Wt 172.0 lb

## 2020-10-30 DIAGNOSIS — R7303 Prediabetes: Secondary | ICD-10-CM | POA: Diagnosis not present

## 2020-10-30 DIAGNOSIS — E559 Vitamin D deficiency, unspecified: Secondary | ICD-10-CM

## 2020-10-30 DIAGNOSIS — Z6832 Body mass index (BMI) 32.0-32.9, adult: Secondary | ICD-10-CM

## 2020-10-30 DIAGNOSIS — E669 Obesity, unspecified: Secondary | ICD-10-CM

## 2020-11-06 NOTE — Progress Notes (Signed)
Chief Complaint:   OBESITY Anna Anna [name] is here to discuss her progress with her obesity treatment plan along with follow-up of her obesity related diagnoses. Anna Anna [name] is on the Category 2 Plan and the [place] and states she is following her eating plan approximately 95% of the time. Anna [name] states she is weight training and doing cardio 30-60 minutes 5-7 times per week.  Today's visit was #: 5 Starting weight: 181 lbs Starting date: 08/07/2020 Today's weight: 172 lbs Today's date: 10/30/2020 Total lbs lost to date: 9 Total lbs lost since last in-office visit: 0  Interim History: Anna Anna [name] has been exercising and has been very consistent with that. She has been doing [employer] and 15 minutes of bike or calisthenics. Pt denies hunger. She is having a cookout at home for the 4th of July. She hasn't been consistent with water this week, but she hasn't been stress eating though. She is mixing up category 1 and 2. She is getting in full 8 oz at dinner.    Subjective:   1. Pre-diabetes Anna Anna [name]'s last A1c was 6.0 and insulin Anna [name] was not done. She is not on medications.  2. Vitamin D deficiency Anna Anna [name] denies nausea, vomiting, and muscle weakness but notes fatigue. Pt is on prescription Vit D.  Assessment/Plan:   1. Pre-diabetes Anna Anna [name] will continue to work on weight loss, exercise, and decreasing simple carbohydrates to help decrease the risk of diabetes. Continue meal plan.  2. Vitamin D deficiency Anna Anna [name] contributes to fatigue and are associated with obesity, breast, and colon cancer. She agrees to continue to take prescription Vitamin D @50 ,000 IU every week and will follow-up for routine testing of Vitamin D, at least 2-3 times per year to avoid over-replacement.  3. Obesity, current BMI 30  Anna Anna [name] is currently in the action stage of change. As such, her goal is to continue with weight loss efforts. She has agreed to the Category 2 Plan and the [place].   Exercise goals:  As is  Behavioral modification strategies: increasing lean protein intake, meal planning and cooking strategies, and keeping healthy foods in the home.  Anna [name] has agreed to follow-up with our clinic in 3 weeks. She was informed of the importance of frequent follow-up visits to maximize her success with intensive lifestyle modifications for her multiple health conditions.   Objective:   Blood pressure 106/70, pulse 69, temperature 98.3 F (36.8 C), height 5\' 3"  (1.6 m), weight 172 lb (78 kg), SpO2 97 %. Body mass index is 30.47 kg/m.  General: Cooperative, alert, well developed, in no acute distress. HEENT: Conjunctivae and lids unremarkable. Cardiovascular: Regular rhythm.  Lungs: Normal work of breathing. Neurologic: No focal deficits.   Lab Results  Component Value Date   CREATININE 0.61 08/07/2020   BUN 12 08/07/2020   NA 140 08/07/2020   K 4.5 08/07/2020   CL 97 08/07/2020   CO2 22 08/07/2020   Lab Results  Component Value Date   ALT 11 08/07/2020   AST 21 08/07/2020   ALKPHOS 82 08/07/2020   BILITOT 0.3 08/07/2020   Lab Results  Component Value Date   HGBA1C 6.0 (H) 08/07/2020   HGBA1C 5.6 01/21/2016   HGBA1C 5.7 03/16/2014   HGBA1C 5.8 12/06/2013   HGBA1C 5.5 08/31/2013   No results found for: INSULIN Lab Results  Component Value Date   TSH 1.130 08/07/2020   Lab Results  Component Value Date   CHOL 152 08/07/2020   HDL 65 08/07/2020  LDLCALC 74 08/07/2020   TRIG 67 08/07/2020   CHOLHDL 3 03/16/2014   Lab Results  Component Value Date   VD25OH 23.6 (L) 08/07/2020   VD25OH 24 (L) 02/27/2020   Lab Results  Component Value Date   WBC 3.1 (L) 08/07/2020   HGB 13.6 08/07/2020   HCT 40.7 08/07/2020   MCV 85 08/07/2020   PLT 258 02/27/2020   Lab Results  Component Value Date   IRON 94 12/03/2017   FERRITIN 127.4 12/03/2017    Attestation Statements:   Reviewed by clinician on day of visit: allergies,  medications, problem list, medical history, surgical history, family history, social history, and previous encounter notes.  Coral Ceo, CMA, am acting as transcriptionist for Coralie Common, MD.  I have reviewed the above documentation for accuracy and completeness, and I agree with the above. - Jinny Blossom, MD

## 2020-11-20 ENCOUNTER — Ambulatory Visit (INDEPENDENT_AMBULATORY_CARE_PROVIDER_SITE_OTHER): Payer: 59 | Admitting: Family Medicine

## 2021-01-30 ENCOUNTER — Telehealth: Payer: Self-pay | Admitting: Family Medicine

## 2021-01-30 ENCOUNTER — Other Ambulatory Visit: Payer: Self-pay | Admitting: Family Medicine

## 2021-01-30 DIAGNOSIS — F458 Other somatoform disorders: Secondary | ICD-10-CM

## 2021-01-30 DIAGNOSIS — I779 Disorder of arteries and arterioles, unspecified: Secondary | ICD-10-CM

## 2021-01-30 MED ORDER — CYCLOBENZAPRINE HCL 10 MG PO TABS: 10.0000 mg | ORAL_TABLET | Freq: Three times a day (TID) | ORAL | 2 refills | Status: DC | PRN
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
## 2021-01-31 ENCOUNTER — Ambulatory Visit: Payer: 59 | Admitting: Family Medicine

## 2021-02-13 ENCOUNTER — Ambulatory Visit (INDEPENDENT_AMBULATORY_CARE_PROVIDER_SITE_OTHER): Payer: 59

## 2021-02-13 ENCOUNTER — Other Ambulatory Visit: Payer: Self-pay | Admitting: Family Medicine

## 2021-02-13 ENCOUNTER — Other Ambulatory Visit: Payer: Self-pay

## 2021-02-13 DIAGNOSIS — R0789 Other chest pain: Secondary | ICD-10-CM

## 2021-02-13 DIAGNOSIS — I779 Disorder of arteries and arterioles, unspecified: Secondary | ICD-10-CM

## 2021-02-14 ENCOUNTER — Encounter: Payer: Self-pay | Admitting: Family Medicine

## 2021-02-14 ENCOUNTER — Ambulatory Visit (HOSPITAL_BASED_OUTPATIENT_CLINIC_OR_DEPARTMENT_OTHER)
Admission: RE | Admit: 2021-02-14 | Discharge: 2021-02-14 | Disposition: A | Discharge status: Home / Self Care | Payer: 59 | Source: Ambulatory Visit | Attending: Family Medicine | Admitting: Family Medicine

## 2021-02-14 ENCOUNTER — Ambulatory Visit: Payer: 59 | Admitting: Family Medicine

## 2021-02-14 VITALS — BP 112/72 | HR 77 | Temp 98.3°F | Resp 18 | Ht 63.0 in | Wt 172.4 lb

## 2021-02-14 DIAGNOSIS — R0789 Other chest pain: Secondary | ICD-10-CM | POA: Diagnosis not present

## 2021-02-14 DIAGNOSIS — R519 Headache, unspecified: Secondary | ICD-10-CM

## 2021-02-14 DIAGNOSIS — M542 Cervicalgia: Secondary | ICD-10-CM

## 2021-02-14 DIAGNOSIS — G8929 Other chronic pain: Secondary | ICD-10-CM

## 2021-02-14 DIAGNOSIS — R42 Dizziness and giddiness: Secondary | ICD-10-CM

## 2021-02-14 DIAGNOSIS — R109 Unspecified abdominal pain: Secondary | ICD-10-CM

## 2021-02-14 LAB — COMPREHENSIVE METABOLIC PANEL
ALT: 10 U/L (ref 0–35)
AST: 17 U/L (ref 0–37)
Albumin: 4.4 g/dL (ref 3.5–5.2)
Alkaline Phosphatase: 70 U/L (ref 39–117)
BUN: 13 mg/dL (ref 6–23)
CO2: 33 mEq/L — ABNORMAL HIGH (ref 19–32)
Calcium: 10 mg/dL (ref 8.4–10.5)
Chloride: 101 mEq/L (ref 96–112)
Creatinine, Ser: 0.75 mg/dL (ref 0.40–1.20)
GFR: 98.49 mL/min (ref >60.00)
Glucose, Bld: 103 mg/dL — ABNORMAL HIGH (ref 70–99)
Potassium: 4.3 mEq/L (ref 3.5–5.1)
Sodium: 139 mEq/L (ref 135–145)
Total Bilirubin: 0.4 mg/dL (ref 0.2–1.2)
Total Protein: 7 g/dL (ref 6.0–8.3)

## 2021-02-14 LAB — D-DIMER, QUANTITATIVE: D-Dimer, Quant: <0.19 mcg/mL FEU (ref <0.50)

## 2021-02-14 LAB — CBC WITH DIFFERENTIAL/PLATELET
Basophils Absolute: 0 10*3/uL (ref 0.0–0.1)
Basophils Relative: 0.4 % (ref 0.0–3.0)
Eosinophils Absolute: 0.2 10*3/uL (ref 0.0–0.7)
Eosinophils Relative: 6.1 % — ABNORMAL HIGH (ref 0.0–5.0)
HCT: 37.3 % (ref 36.0–46.0)
Hemoglobin: 12.3 g/dL (ref 12.0–15.0)
Lymphocytes Relative: 45.2 % (ref 12.0–46.0)
Lymphs Abs: 1.3 10*3/uL (ref 0.7–4.0)
MCHC: 32.9 g/dL (ref 30.0–36.0)
MCV: 85.5 fl (ref 78.0–100.0)
Monocytes Absolute: 0.2 10*3/uL (ref 0.1–1.0)
Monocytes Relative: 5.7 % (ref 3.0–12.0)
Neutro Abs: 1.3 10*3/uL — ABNORMAL LOW (ref 1.4–7.7)
Neutrophils Relative %: 42.6 % — ABNORMAL LOW (ref 43.0–77.0)
Platelets: 214 10*3/uL (ref 150.0–400.0)
RBC: 4.36 Mil/uL (ref 3.87–5.11)
RDW: 14.4 % (ref 11.5–15.5)
WBC: 3 10*3/uL — ABNORMAL LOW (ref 4.0–10.5)

## 2021-02-14 LAB — LIPID PANEL
Cholesterol: 145 mg/dL (ref 0–200)
HDL: 55.6 mg/dL (ref >39.00)
LDL Cholesterol: 78 mg/dL (ref 0–99)
NonHDL: 89.36
Total CHOL/HDL Ratio: 3
Triglycerides: 57 mg/dL (ref 0.0–149.0)
VLDL: 11.4 mg/dL (ref 0.0–40.0)

## 2021-02-14 LAB — SEDIMENTATION RATE: Sed Rate: 10 mm/hr (ref 0–20)

## 2021-02-14 LAB — TSH: TSH: 1.27 u[IU]/mL (ref 0.35–5.50)

## 2021-02-14 LAB — VITAMIN B12: Vitamin B-12: 206 pg/mL — ABNORMAL LOW (ref 211–911)

## 2021-02-14 MED ORDER — ALPRAZOLAM 0.25 MG PO TABS: 0.2500 mg | ORAL_TABLET | Freq: Two times a day (BID) | ORAL | 0 refills | Status: DC | PRN

## 2021-02-14 MED ORDER — FAMOTIDINE 20 MG PO TABS: 20.0000 mg | ORAL_TABLET | Freq: Two times a day (BID) | ORAL | 1 refills | Status: DC

## 2021-02-14 NOTE — Assessment & Plan Note (Signed)
ekg ---- nsr--- nonspecific t wave changes  D dimer

## 2021-02-14 NOTE — Patient Instructions (Signed)
Nonspecific Chest Pain, Adult °Chest pain is an uncomfortable, tight, or painful feeling in the chest. The pain can feel like a crushing, aching, or squeezing pressure. A person can feel a burning or tingling sensation. Chest pain can also be felt in your back, neck, jaw, shoulder, or arm. This pain can be worse when you move, sneeze, or take a deep breath. °Chest pain can be caused by a condition that is life-threatening. This must be treated right away. It can also be caused by something that is not life-threatening. If you have chest pain, it can be hard to know the difference, so it is important to get help right away to make sure that you do not have a serious condition. °Some life-threatening causes of chest pain include: °Heart attack. °A tear in the body's main blood vessel (aortic dissection). °Inflammation around your heart (pericarditis). °A problem in the lungs, such as a blood clot (pulmonary embolism) or a collapsed lung (pneumothorax). °Some non life-threatening causes of chest pain include: °Heartburn. °Anxiety or stress. °Damage to the bones, muscles, and cartilage that make up your chest wall. °Pneumonia or bronchitis. °Shingles infection (varicella-zoster virus). °Your chest pain may come and go. It may also be constant. Your health care provider will do tests and other studies to find the cause of your pain. Treatment will depend on the cause of your chest pain. °Follow these instructions at home: °Medicines °Take over-the-counter and prescription medicines only as told by your health care provider. °If you were prescribed an antibiotic medicine, take it as told by your health care provider. Do not stop taking the antibiotic even if you start to feel better. °Activity °Avoid any activities that cause chest pain. °Do not lift anything that is heavier than 10 lb (4.5 kg), or the limit that you are told, until your health care provider says that it is safe. °Rest as directed by your health care  provider. °Return to your normal activities only as told by your health care provider. Ask your health care provider what activities are safe for you. °Lifestyle °  °Do not use any products that contain nicotine or tobacco, such as cigarettes, e-cigarettes, and chewing tobacco. If you need help quitting, ask your health care provider. °Do not drink alcohol. °Make healthy lifestyle changes as recommended. These may include: °Getting regular exercise. Ask your health care provider to suggest some exercises that are safe for you. °Eating a heart-healthy diet. This includes plenty of fresh fruits and vegetables, whole grains, low-fat (lean) protein, and low-fat dairy products. A dietitian can help you find healthy eating options. °Maintaining a healthy weight. °Managing any other health conditions you may have, such as high blood pressure (hypertension) or diabetes. °Reducing stress, such as with yoga or relaxation techniques. °General instructions °Pay attention to any changes in your symptoms. °It is up to you to get the results of any tests that were done. Ask your health care provider, or the department that is doing the tests, when your results will be ready. °Keep all follow-up visits as told by your health care provider. This is important. °You may be asked to go for further testing if your chest pain does not go away. °Contact a health care provider if: °Your chest pain does not go away. °You feel depressed. °You have a fever. °You notice changes in your symptoms or develop new symptoms. °Get help right away if: °Your chest pain gets worse. °You have a cough that gets worse, or you cough up   blood. °You have severe pain in your abdomen. °You faint. °You have sudden, unexplained chest discomfort. °You have sudden, unexplained discomfort in your arms, back, neck, or jaw. °You have shortness of breath at any time. °You suddenly start to sweat, or your skin gets clammy. °You feel nausea or you vomit. °You suddenly  feel lightheaded or dizzy. °You have severe weakness, or unexplained weakness or fatigue. °Your heart begins to beat quickly, or it feels like it is skipping beats. °These symptoms may represent a serious problem that is an emergency. Do not wait to see if the symptoms will go away. Get medical help right away. Call your local emergency services (911 in the [place] Do not drive yourself to the hospital. °Summary °Chest pain can be caused by a condition that is serious and requires urgent treatment. It may also be caused by something that is not life-threatening. °Your health care provider may do lab tests and other studies to find the cause of your pain. °Follow your health care provider's instructions on taking medicines, making lifestyle changes, and getting emergency treatment if symptoms become worse. °Keep all follow-up visits as told by your health care provider. This includes visits for any further testing if your chest pain does not go away. °This information is not intended to replace advice given to you by your health care provider. Make sure you discuss any questions you have with your health care provider. °Document Revised: 07/04/2020 Document Reviewed: 07/04/2020 °Elsevier Patient Education © 2022 [employer] ° °

## 2021-02-14 NOTE — Assessment & Plan Note (Signed)
Ct angiogram

## 2021-02-14 NOTE — Progress Notes (Signed)
Subjective:   By signing my name below, I, Zite Okoli, attest that this documentation has been prepared under the direction and in the presence of Ann Held, DO. 02/14/2021   Patient ID: Anna [name], female    DOB: Dec 22, 1980, 40 y.o.   MRN: [id number]  Chief Complaint  Patient presents with   Chest Pain   Headache    Pt states chest pain and headaches going on for a few months. Pt reports no SOB, occ dizziness. Pt states waking everyday with a headache and neck pain.     HPI Patient is in today for an office visit.  She reports that for the past 2 months, she has been waking up with a constant headache on the right side of her head with occasional dizziness. She is also experiencing constant pain on both sides of the neck and pulsating on the left side of her neck. Her right ear was also ringing when she was at work last week. She started experiencing chest pain last week and felt like there was pressure on her chest. She used Xanax because she thought it was anxiety but she slept and it did not resolve the pain. She also drank coke because she suspected it was gas and the pain subsided but it was not resolved. The chest pain is intermittent but is not exacerbated by anything and lasts for short periods of time.  When her EKG was done at this visit, the chest pain radiated down to her stomach. The EKG done at this visit is normal.  She denies shortness of breath and abdominal pain.  Past Medical History:  Diagnosis Date   Allergy    Anemia    Blood transfusion without reported diagnosis    at birth   Excessive weight gain    GERD (gastroesophageal reflux disease)    IBS (irritable bowel syndrome)    IBS (irritable bowel syndrome)    Insulin resistance    Menopause    Migraine    Other fatigue    PVC (premature ventricular contraction)    SOB (shortness of breath) on exertion    Vitamin B deficiency    Vitamin D deficiency     Past Surgical History:   Procedure Laterality Date   BREAST EXCISIONAL BIOPSY     CHOLECYSTECTOMY N/A 09/12/2015   Procedure: LAPAROSCOPIC CHOLECYSTECTOMY;  Surgeon: Rolm Bookbinder, MD;  Location: [facility];  Service: General;  Laterality: N/A;   ENDOMETRIAL ABLATION     RADIOACTIVE SEED GUIDED EXCISIONAL BREAST BIOPSY Right 09/12/2015   Procedure: RADIOACTIVE SEED GUIDED EXCISIONAL RIGHT BREAST BIOPSY;  Surgeon: Rolm Bookbinder, MD;  Location: [facility];  Service: General;  Laterality: Right;   TUBAL LIGATION  06-04-2002   WISDOM TOOTH EXTRACTION  2010    Family History  Problem Relation Age of Onset   Arthritis Maternal Uncle    Heart disease Maternal Uncle    Diabetes Mother    Hypertension Mother    Depression Mother    Heart failure Father    Arthritis Maternal Grandmother    Colon cancer Maternal Grandmother    Hypertension Maternal Grandmother    Diabetes Maternal Grandmother    Ovarian cancer Maternal Aunt    Prostate cancer Maternal Uncle    Stroke Maternal Grandfather    Kidney disease Maternal Grandfather    Heart disease Maternal Grandfather    Kidney failure Maternal Grandfather    Diabetes Maternal Grandfather    Diabetes  Paternal Grandmother    Bone cancer Paternal Grandmother    Breast cancer Paternal Grandmother    Prostate cancer Maternal Uncle    Colon cancer Maternal Uncle    Depression Other    Esophageal cancer Neg Hx    Rectal cancer Neg Hx    Stomach cancer Neg Hx     Social History   Socioeconomic History   Marital status: Married    Spouse name: Not on file   Number of children: 2   Years of education: Not on file   Highest education level: Not on file  Occupational History   Occupation: Chief of Staff  Tobacco Use   Smoking status: Never   Smokeless tobacco: Never  Vaping Use   Vaping Use: Never used  Substance and Sexual Activity   Alcohol use: No   Drug use: No   Sexual activity: Yes    Birth  control/protection: Surgical  Other Topics Concern   Not on file  Social History Narrative   Not on file   Social Determinants of Health   Financial Resource Strain: Not on file  Food Insecurity: Not on file  Transportation Needs: Not on file  Physical Activity: Not on file  Stress: Not on file  Social Connections: Not on file  Intimate Partner Violence: Not on file    Outpatient Medications Prior to Visit  Medication Sig Dispense Refill   ALPRAZolam (XANAX) 0.25 MG tablet Take 1 tablet (0.25 mg total) by mouth 2 (two) times daily as needed for anxiety. 20 tablet 0   calcium-vitamin D (OSCAL WITH D) 500-200 MG-UNIT tablet Take 1 tablet by mouth.     cyanocobalamin (,VITAMIN B-12,) 1000 MCG/ML injection Inject 1 ml or 1000 mcg of Vit B12 into muscle monthly for 1 year 12 mL 1   cyclobenzaprine (FLEXERIL) 10 MG tablet Take 1 tablet (10 mg total) by mouth 3 (three) times daily as needed for muscle spasms. 30 tablet 2   levocetirizine (XYZAL) 5 MG tablet Take 1 tablet (5 mg total) by mouth every evening. 90 tablet 3   Probiotic Product (ALIGN) 4 MG CAPS Take 1 capsule by mouth every other day.     valACYclovir (VALTREX) 1000 MG tablet Take 1 tablet (1,000 mg total) by mouth 3 (three) times daily. 30 tablet 2   No facility-administered medications prior to visit.    Allergies  Allergen Reactions   Aciphex [Rabeprazole Sodium]    Latex    Rabeprazole Sodium     Review of Systems  Constitutional:  Negative for fever.  HENT:  Negative for congestion, ear pain, hearing loss, sinus pain and sore throat.   Eyes:  Negative for blurred vision and pain.  Respiratory:  Negative for cough, sputum production, shortness of breath and wheezing.   Cardiovascular:  Positive for chest pain. Negative for palpitations.  Gastrointestinal:  Negative for blood in stool, constipation, diarrhea, nausea and vomiting.  Genitourinary:  Negative for dysuria, frequency, hematuria and urgency.   Musculoskeletal:  Positive for neck pain. Negative for back pain, falls and myalgias.  Neurological:  Positive for dizziness and headaches. Negative for sensory change, loss of consciousness and weakness.  Endo/Heme/Allergies:  Negative for environmental allergies. Does not bruise/bleed easily.  Psychiatric/Behavioral:  Negative for depression and suicidal ideas. The patient is not nervous/anxious and does not have insomnia.       Objective:    Physical Exam Constitutional:      General: She is not in acute distress.    Appearance:  Normal appearance. She is not ill-appearing.  HENT:     Head: Normocephalic and atraumatic.     Right Ear: External ear normal.     Left Ear: External ear normal.  Eyes:     Extraocular Movements: Extraocular movements intact.     Pupils: Pupils are equal, round, and reactive to light.  Cardiovascular:     Rate and Rhythm: Normal rate and regular rhythm.     Pulses: Normal pulses.     Heart sounds: Normal heart sounds. No murmur heard.   No gallop.  Pulmonary:     Effort: Pulmonary effort is normal. No respiratory distress.     Breath sounds: Normal breath sounds. No wheezing, rhonchi or rales.  Abdominal:     General: Bowel sounds are normal. There is no distension.     Palpations: Abdomen is soft. There is no mass.     Tenderness: There is no abdominal tenderness. There is no guarding or rebound.     Hernia: No hernia is present.  Musculoskeletal:     Cervical back: Normal range of motion and neck supple.  Lymphadenopathy:     Cervical: No cervical adenopathy.  Skin:    General: Skin is warm and dry.  Neurological:     Mental Status: She is alert and oriented to person, place, and time.  Psychiatric:        Behavior: Behavior normal.    BP 112/72 (BP Location: Left Arm, Patient Position: Sitting, Cuff Size: Normal)   Pulse 77   Temp 98.3 F (36.8 C) (Oral)   Resp 18   Ht 5' 3"  (1.6 m)   Wt 172 lb 6.4 oz (78.2 kg)   SpO2 96%   BMI  30.54 kg/m  Wt Readings from Last 3 Encounters:  02/14/21 172 lb 6.4 oz (78.2 kg)  10/30/20 172 lb (78 kg)  10/14/20 172 lb (78 kg)    Diabetic Foot Exam - Simple   No data filed    Lab Results  Component Value Date   WBC 3.1 (L) 08/07/2020   HGB 13.6 08/07/2020   HCT 40.7 08/07/2020   PLT 258 02/27/2020   GLUCOSE 93 08/07/2020   CHOL 152 08/07/2020   TRIG 67 08/07/2020   HDL 65 08/07/2020   LDLCALC 74 08/07/2020   ALT 11 08/07/2020   AST 21 08/07/2020   NA 140 08/07/2020   K 4.5 08/07/2020   CL 97 08/07/2020   CREATININE 0.61 08/07/2020   BUN 12 08/07/2020   CO2 22 08/07/2020   TSH 1.130 08/07/2020   HGBA1C 6.0 (H) 08/07/2020    Lab Results  Component Value Date   TSH 1.130 08/07/2020   Lab Results  Component Value Date   WBC 3.1 (L) 08/07/2020   HGB 13.6 08/07/2020   HCT 40.7 08/07/2020   MCV 85 08/07/2020   PLT 258 02/27/2020   Lab Results  Component Value Date   NA 140 08/07/2020   K 4.5 08/07/2020   CO2 22 08/07/2020   GLUCOSE 93 08/07/2020   BUN 12 08/07/2020   CREATININE 0.61 08/07/2020   BILITOT 0.3 08/07/2020   ALKPHOS 82 08/07/2020   AST 21 08/07/2020   ALT 11 08/07/2020   PROT 7.4 08/07/2020   ALBUMIN 4.6 08/07/2020   CALCIUM 10.1 08/07/2020   ANIONGAP 10 01/17/2019   EGFR 115 08/07/2020   GFR 118.03 01/03/2019   Lab Results  Component Value Date   CHOL 152 08/07/2020   Lab Results  Component Value  Date   HDL 65 08/07/2020   Lab Results  Component Value Date   LDLCALC 74 08/07/2020   Lab Results  Component Value Date   TRIG 67 08/07/2020   Lab Results  Component Value Date   CHOLHDL 3 03/16/2014   Lab Results  Component Value Date   HGBA1C 6.0 (H) 08/07/2020       Assessment & Plan:   Problem List Items Addressed This Visit       Unprioritized   Chest pain - Primary    ekg ---- nsr--- nonspecific t wave changes  D dimer       Relevant Medications   famotidine (PEPCID) 20 MG tablet   ALPRAZolam (XANAX)  0.25 MG tablet   Other Relevant Orders   EKG 12-Lead (Completed)   CT ANGIO HEAD NECK W WO CM   DG Chest 2 View   Chronic intractable headache    Ct angiogram      Relevant Orders   CT ANGIO HEAD NECK W WO CM   Dizziness   Relevant Orders   CT ANGIO HEAD NECK W WO CM   Neck pain   Relevant Orders   CT ANGIO HEAD NECK W WO CM   Other Visit Diagnoses     Flank pain           Meds ordered this encounter  Medications   famotidine (PEPCID) 20 MG tablet    Sig: Take 1 tablet (20 mg total) by mouth 2 (two) times daily.    Dispense:  60 tablet    Refill:  1   ALPRAZolam (XANAX) 0.25 MG tablet    Sig: Take 1 tablet (0.25 mg total) by mouth 2 (two) times daily as needed for anxiety.    Dispense:  20 tablet    Refill:  0   I,Zite Okoli,acting as a scribe for [employer], DO.,have documented all relevant documentation on the behalf of Ann Held, DO,as directed by  Ann Held, DO while in the presence of Ann Held, [place], DO., personally preformed the services described in this documentation.  All medical record entries made by the scribe were at my direction and in my presence.  I have reviewed the chart and discharge instructions (if applicable) and agree that the record reflects my personal performance and is accurate and complete. 02/14/21

## 2021-02-18 ENCOUNTER — Other Ambulatory Visit: Payer: Self-pay

## 2021-02-18 ENCOUNTER — Encounter (HOSPITAL_BASED_OUTPATIENT_CLINIC_OR_DEPARTMENT_OTHER): Payer: Self-pay

## 2021-02-18 ENCOUNTER — Ambulatory Visit (HOSPITAL_BASED_OUTPATIENT_CLINIC_OR_DEPARTMENT_OTHER)
Admission: RE | Admit: 2021-02-18 | Discharge: 2021-02-18 | Disposition: A | Discharge status: Home / Self Care | Payer: 59 | Source: Ambulatory Visit | Attending: Family Medicine | Admitting: Family Medicine

## 2021-02-18 DIAGNOSIS — M542 Cervicalgia: Secondary | ICD-10-CM | POA: Diagnosis present

## 2021-02-18 DIAGNOSIS — R519 Headache, unspecified: Secondary | ICD-10-CM | POA: Insufficient documentation

## 2021-02-18 DIAGNOSIS — G8929 Other chronic pain: Secondary | ICD-10-CM | POA: Insufficient documentation

## 2021-02-18 DIAGNOSIS — R0789 Other chest pain: Secondary | ICD-10-CM | POA: Diagnosis not present

## 2021-02-18 DIAGNOSIS — R42 Dizziness and giddiness: Secondary | ICD-10-CM | POA: Diagnosis present

## 2021-02-18 DIAGNOSIS — E538 Deficiency of other specified B group vitamins: Secondary | ICD-10-CM

## 2021-02-18 MED ORDER — IOHEXOL 350 MG/ML SOLN
100.0000 mL | Freq: Once | INTRAVENOUS | Status: AC | PRN
  Administered 2021-02-18: 75 mL via INTRAVENOUS

## 2021-02-18 MED ORDER — CYANOCOBALAMIN 1000 MCG/ML IJ SOLN: INTRAMUSCULAR | 1 refills | Status: DC

## 2021-02-27 ENCOUNTER — Encounter: Payer: Self-pay | Admitting: Family Medicine

## 2021-02-28 ENCOUNTER — Other Ambulatory Visit: Payer: Self-pay | Admitting: Family Medicine

## 2021-03-31 ENCOUNTER — Other Ambulatory Visit: Payer: Self-pay | Admitting: Family Medicine

## 2021-03-31 DIAGNOSIS — G4459 Other complicated headache syndrome: Secondary | ICD-10-CM

## 2021-03-31 MED ORDER — SUMATRIPTAN SUCCINATE 50 MG PO TABS: 50.0000 mg | ORAL_TABLET | ORAL | 0 refills | Status: DC | PRN

## 2021-05-23 ENCOUNTER — Other Ambulatory Visit: Payer: Self-pay | Admitting: Family Medicine

## 2021-05-23 ENCOUNTER — Encounter: Payer: Self-pay | Admitting: Family Medicine

## 2021-05-23 DIAGNOSIS — R079 Chest pain, unspecified: Secondary | ICD-10-CM

## 2021-05-28 ENCOUNTER — Ambulatory Visit: Payer: 59 | Admitting: Cardiology

## 2021-06-06 NOTE — Progress Notes (Signed)
Referring-Yvonne Lowne Chase DO Reason for referral-chest pain  HPI: 41 year old female for evaluation of chest pain at request of Roma Schanz, DO.  Previously seen by Dr. Martinique for palpitations but not since 2013.  Echocardiogram September 2020 showed normal LV function and trace aortic insufficiency.  Monitor September 2020 showed sinus to sinus tachycardia.  Carotid Dopplers October 2022 showed no significant stenosis though duplex suggesting fibromuscular dysplasia.  CTA October 2022 was normal.  Patient states that over the past several months she has had occasional chest pain.  It is substernal and described as a "elephant sitting on her chest".  It is not pleuritic or related to food.  It is not exertional.  It can last 30 minutes and resolve spontaneously.  No associated symptoms.  The pain does not radiate.  Note she does not have exertional chest pain.  No dyspnea on exertion, orthopnea, PND, pedal edema or syncope.  Occasional palpitations with her PVCs.  Cardiology now asked to evaluate.  Current Outpatient Medications  Medication Sig Dispense Refill   ALPRAZolam (XANAX) 0.25 MG tablet Take 1 tablet (0.25 mg total) by mouth 2 (two) times daily as needed for anxiety. 20 tablet 0   ALPRAZolam (XANAX) 0.25 MG tablet Take 1 tablet (0.25 mg total) by mouth 2 (two) times daily as needed for anxiety. 20 tablet 0   calcium-vitamin D (OSCAL WITH D) 500-200 MG-UNIT tablet Take 1 tablet by mouth.     cyanocobalamin (,VITAMIN B-12,) 1000 MCG/ML injection Inject 1 ml or 1000 mcg of Vit B12 into muscle monthly for 1 year 12 mL 1   cyclobenzaprine (FLEXERIL) 10 MG tablet Take 1 tablet (10 mg total) by mouth 3 (three) times daily as needed for muscle spasms. 30 tablet 2   famotidine (PEPCID) 20 MG tablet Take 1 tablet (20 mg total) by mouth 2 (two) times daily. 60 tablet 1   levocetirizine (XYZAL) 5 MG tablet Take 1 tablet (5 mg total) by mouth every evening. 90 tablet 3   Probiotic Product  (ALIGN) 4 MG CAPS Take 1 capsule by mouth every other day.     SUMAtriptan (IMITREX) 50 MG tablet Take 1 tablet (50 mg total) by mouth every 2 (two) hours as needed for migraine. May repeat in 2 hours if headache persists or recurs. 10 tablet 0   valACYclovir (VALTREX) 1000 MG tablet Take 1 tablet (1,000 mg total) by mouth 3 (three) times daily. 30 tablet 2   No current facility-administered medications for this visit.    Allergies  Allergen Reactions   Aciphex [Rabeprazole Sodium]    Covid-19 (Adenovirus) Vaccine Other (See Comments)    Per patient---Numb throat   Latex    Rabeprazole Sodium      Past Medical History:  Diagnosis Date   Allergy    Anemia    Blood transfusion without reported diagnosis    at birth   Excessive weight gain    GERD (gastroesophageal reflux disease)    IBS (irritable bowel syndrome)    Insulin resistance    Menopause    Migraine    Other fatigue    PVC (premature ventricular contraction)    Vitamin B deficiency    Vitamin D deficiency     Past Surgical History:  Procedure Laterality Date   BREAST EXCISIONAL BIOPSY     CHOLECYSTECTOMY N/A 09/12/2015   Procedure: LAPAROSCOPIC CHOLECYSTECTOMY;  Surgeon: Rolm Bookbinder, MD;  Location: [facility];  Service: General;  Laterality: N/A;  ENDOMETRIAL ABLATION     RADIOACTIVE SEED GUIDED EXCISIONAL BREAST BIOPSY Right 09/12/2015   Procedure: RADIOACTIVE SEED GUIDED EXCISIONAL RIGHT BREAST BIOPSY;  Surgeon: Rolm Bookbinder, MD;  Location: [facility];  Service: General;  Laterality: Right;   TUBAL LIGATION  06-04-2002   WISDOM TOOTH EXTRACTION  2010    Social History   Socioeconomic History   Marital status: Married    Spouse name: Not on file   Number of children: 7   Years of education: Not on file   Highest education level: Not on file  Occupational History   Occupation: Chief of Staff  Tobacco Use   Smoking status: Never   Smokeless tobacco: Never   Vaping Use   Vaping Use: Never used  Substance and Sexual Activity   Alcohol use: No   Drug use: No   Sexual activity: Yes    Birth control/protection: Surgical  Other Topics Concern   Not on file  Social History Narrative   Not on file   Social Determinants of Health   Financial Resource Strain: Not on file  Food Insecurity: Not on file  Transportation Needs: Not on file  Physical Activity: Not on file  Stress: Not on file  Social Connections: Not on file  Intimate Partner Violence: Not on file    Family History  Problem Relation Age of Onset   Arthritis Maternal Uncle    Heart disease Maternal Uncle    Diabetes Mother    Hypertension Mother    Depression Mother    Heart failure Father    Arthritis Maternal Grandmother    Colon cancer Maternal Grandmother    Hypertension Maternal Grandmother    Diabetes Maternal Grandmother    Ovarian cancer Maternal Aunt    Prostate cancer Maternal Uncle    Stroke Maternal Grandfather    Kidney disease Maternal Grandfather    Heart disease Maternal Grandfather    Kidney failure Maternal Grandfather    Diabetes Maternal Grandfather    Diabetes Paternal Grandmother    Bone cancer Paternal Grandmother    Breast cancer Paternal Grandmother    Prostate cancer Maternal Uncle    Colon cancer Maternal Uncle    Depression Other    Esophageal cancer Neg Hx    Rectal cancer Neg Hx    Stomach cancer Neg Hx     ROS: no fevers or chills, productive cough, hemoptysis, dysphasia, odynophagia, melena, hematochezia, dysuria, hematuria, rash, seizure activity, orthopnea, PND, pedal edema, claudication. Remaining systems are negative.  Physical Exam:   Blood pressure 114/78, pulse 64, height 5\' 3"  (1.6 m), weight 175 lb (79.4 kg), SpO2 92 %.  General:  Well developed/well nourished in NAD Skin warm/dry Patient not depressed No peripheral clubbing Back-normal HEENT-normal/normal eyelids Neck supple/normal carotid upstroke bilaterally;  no bruits; no JVD; no thyromegaly chest - CTA/ normal expansion CV - RRR/normal S1 and S2; no murmurs, rubs or gallops;  PMI nondisplaced Abdomen -NT/ND, no HSM, no mass, + bowel sounds, no bruit 2+ femoral pulses, no bruits Ext-no edema, chords, 2+ DP Neuro-grossly nonfocal  ECG -normal sinus rhythm at a rate of 64, no ST changes.  Personally reviewed  A/P  1 chest pain-symptoms are atypical.  Previous D-dimer normal.  Electrocardiogram shows no acute ST changes.  We discussed proceeding with cardiac CTA for definitive evaluation today.  However she would like to be conservative for now which is not unreasonable as her symptoms are improving.  We will consider further evaluation in the future if  she has recurrences.  2 PVCs-she occasionally feels increased PVCs with caffeine but reasonly well controlled.  We will consider addition of beta-blocker in the future if necessary.  Note her LV function was normal on last echocardiogram.  Kirk Ruths, MD

## 2021-06-18 ENCOUNTER — Ambulatory Visit: Payer: 59 | Admitting: Cardiology

## 2021-06-18 ENCOUNTER — Encounter: Payer: Self-pay | Admitting: Cardiology

## 2021-06-18 ENCOUNTER — Other Ambulatory Visit: Payer: Self-pay

## 2021-06-18 VITALS — BP 114/78 | HR 64 | Ht 63.0 in | Wt 175.0 lb

## 2021-06-18 DIAGNOSIS — I493 Ventricular premature depolarization: Secondary | ICD-10-CM

## 2021-06-18 DIAGNOSIS — R072 Precordial pain: Secondary | ICD-10-CM

## 2021-06-18 NOTE — Patient Instructions (Signed)

## 2021-08-20 ENCOUNTER — Encounter: Payer: Self-pay | Admitting: Family Medicine

## 2021-10-07 ENCOUNTER — Telehealth: Payer: Self-pay | Admitting: Cardiology

## 2021-10-07 ENCOUNTER — Encounter: Payer: Self-pay | Admitting: Family Medicine

## 2021-10-07 DIAGNOSIS — R072 Precordial pain: Secondary | ICD-10-CM

## 2021-10-07 DIAGNOSIS — B001 Herpesviral vesicular dermatitis: Secondary | ICD-10-CM

## 2021-10-07 MED ORDER — VALACYCLOVIR HCL 1 G PO TABS: 1000.0000 mg | ORAL_TABLET | Freq: Three times a day (TID) | ORAL | 2 refills | Status: AC | PRN

## 2021-10-07 NOTE — Telephone Encounter (Signed)
Left message for patient to call back  

## 2021-10-07 NOTE — Telephone Encounter (Signed)
  Per MyChart scheduling message:  Pt c/o of Chest Pain: STAT if CP now or developed within 24 hours  1. Are you having CP right now?   2. Are you experiencing any other symptoms (ex. SOB, nausea, vomiting, sweating)?   3. How long have you been experiencing CP?   4. Is your CP continuous or coming and going?   5. Have you taken Nitroglycerin?  ?   Good morning,    The chest pain comes and goes, it is the same heaviness that I have been feeling.  I have tried to change my diet and exercise to see if it is stress induced. I want to see if this helps and I will let you guys know. Thank you

## 2021-10-09 ENCOUNTER — Encounter: Payer: Self-pay | Admitting: *Deleted

## 2021-10-09 ENCOUNTER — Other Ambulatory Visit: Payer: Self-pay | Admitting: *Deleted

## 2021-10-09 MED ORDER — METOPROLOL TARTRATE 50 MG PO TABS: ORAL_TABLET | ORAL | 0 refills | Status: DC

## 2021-10-09 NOTE — Telephone Encounter (Signed)
Spoke with pt, she reports she is having the same type of pain she had previously and she is now wanting to have the testing dr Stanford Breed recommended. According to the office note, cardiac CTA ordered. Instructions sent to patient via my chart.

## 2021-10-30 ENCOUNTER — Telehealth (HOSPITAL_COMMUNITY): Payer: Self-pay | Admitting: Emergency Medicine

## 2021-10-30 NOTE — Telephone Encounter (Signed)
Attempted to call patient regarding upcoming cardiac CT appointment. °Left message on voicemail with name and callback number °Terriona Horlacher RN Navigator Cardiac Imaging °[facility] °[phone] Office °[phone] Cell ° °

## 2021-10-30 NOTE — Telephone Encounter (Signed)
Attempted to call patient regarding upcoming cardiac CT appointment. °Left message on voicemail with name and callback number °Vandy Fong RN Navigator Cardiac Imaging °[facility] °[phone] Office °[phone] Cell ° °

## 2021-10-31 ENCOUNTER — Ambulatory Visit (HOSPITAL_COMMUNITY)
Admission: RE | Admit: 2021-10-31 | Discharge: 2021-10-31 | Disposition: A | Discharge status: Home / Self Care | Payer: 59 | Source: Ambulatory Visit | Attending: Cardiology | Admitting: Cardiology

## 2021-10-31 ENCOUNTER — Telehealth (HOSPITAL_COMMUNITY): Payer: Self-pay | Admitting: Emergency Medicine

## 2021-10-31 DIAGNOSIS — R072 Precordial pain: Secondary | ICD-10-CM | POA: Diagnosis present

## 2021-10-31 MED ORDER — NITROGLYCERIN 0.4 MG SL SUBL
0.8000 mg | SUBLINGUAL_TABLET | Freq: Once | SUBLINGUAL | Status: AC
  Administered 2021-10-31: 0.8 mg via SUBLINGUAL

## 2021-10-31 MED ORDER — NITROGLYCERIN 0.4 MG SL SUBL
SUBLINGUAL_TABLET | SUBLINGUAL | Status: AC
  Filled 2021-10-31: qty 2

## 2021-10-31 MED ORDER — IOHEXOL 350 MG/ML SOLN
100.0000 mL | Freq: Once | INTRAVENOUS | Status: AC | PRN
Start: 2021-10-31 — End: 2021-10-31
  Administered 2021-10-31: 100 mL via INTRAVENOUS

## 2021-10-31 NOTE — Telephone Encounter (Signed)
Reaching out to patient to offer assistance regarding upcoming cardiac imaging study; pt verbalizes understanding of appt date/time, parking situation and where to check in, pre-test NPO status and medications ordered, and verified current allergies; name and call back number provided for further questions should they arise Marchia Bond RN Navigator Cardiac Imaging [facility] [phone] office [phone] cell  '50mg'$  metoprolol Denies iv issues Arrival 330

## 2021-11-07 ENCOUNTER — Encounter: Payer: Self-pay | Admitting: *Deleted

## 2021-12-10 ENCOUNTER — Encounter (INDEPENDENT_AMBULATORY_CARE_PROVIDER_SITE_OTHER): Payer: Self-pay

## 2021-12-12 ENCOUNTER — Encounter: Payer: Self-pay | Admitting: Family Medicine

## 2021-12-12 ENCOUNTER — Ambulatory Visit: Payer: 59 | Admitting: Family Medicine

## 2021-12-12 VITALS — BP 98/80 | HR 70 | Temp 98.2°F | Resp 18 | Ht 63.0 in | Wt 171.2 lb

## 2021-12-12 DIAGNOSIS — H60331 Swimmer's ear, right ear: Secondary | ICD-10-CM | POA: Diagnosis not present

## 2021-12-12 DIAGNOSIS — R21 Rash and other nonspecific skin eruption: Secondary | ICD-10-CM | POA: Diagnosis not present

## 2021-12-12 MED ORDER — PREDNISONE 10 MG PO TABS: ORAL_TABLET | ORAL | 0 refills | Status: DC

## 2021-12-12 MED ORDER — OFLOXACIN 0.3 % OT SOLN: 5.0000 [drp] | Freq: Every day | OTIC | 0 refills | Status: DC

## 2021-12-12 NOTE — Progress Notes (Unsigned)
   Established Patient Office Visit  Subjective   Patient ID: MCKENSEY BERGHUIS, female    DOB: [birth year]  Age: 41 y.o. MRN: [id number]  Chief Complaint  Patient presents with   Ear Pain    Pt states having bilateral ear pain after returning from [place]   Rash    X1 month, pt states having a rash on her left chest over her tattoo. Pt states rash is itchy, no redness, and raised.     HPI  {History (Optional):23778}  ROS    Objective:     BP 98/80 (BP Location: Right Arm, Patient Position: Sitting, Cuff Size: Normal)   Pulse 70   Temp 98.2 F (36.8 C) (Oral)   Resp 18   Ht '5\' 3"'$  (1.6 m)   Wt 171 lb 3.2 oz (77.7 kg)   SpO2 99%   BMI 30.33 kg/m  {Vitals History (Optional):23777}  Physical Exam   No results found for any visits on 12/12/21.  {Labs (Optional):23779}  The 10-year ASCVD risk score (Arnett DK, et al., 2019) is: 0.1%    Assessment & Plan:   Problem List Items Addressed This Visit   None   No follow-ups on file.    Ann Held, DO

## 2021-12-14 DIAGNOSIS — R21 Rash and other nonspecific skin eruption: Secondary | ICD-10-CM | POA: Insufficient documentation

## 2021-12-14 DIAGNOSIS — H60331 Swimmer's ear, right ear: Secondary | ICD-10-CM | POA: Insufficient documentation

## 2021-12-14 LAB — ALLERGENS (22) FOODS IGG
Casein IgG: 4.8 ug/mL — ABNORMAL HIGH (ref 0.0–1.9)
Cheese, Mold Type IgG: 12.4 ug/mL — ABNORMAL HIGH (ref 0.0–1.9)
Chicken IgG: 2.8 ug/mL — ABNORMAL HIGH (ref 0.0–1.9)
Chili Pepper IgG: 8.5 ug/mL — ABNORMAL HIGH (ref 0.0–1.9)
Chocolate/Cacao IgG: 4.3 ug/mL — ABNORMAL HIGH (ref 0.0–1.9)
Coffee IgG: 4 ug/mL — ABNORMAL HIGH (ref 0.0–1.9)
Corn IgG: 4.8 ug/mL — ABNORMAL HIGH (ref 0.0–1.9)
Egg, Whole IgG: 6.8 ug/mL — ABNORMAL HIGH (ref 0.0–1.9)
Green Bean IgG: 6.1 ug/mL — ABNORMAL HIGH (ref 0.0–1.9)
Haddock IgG: 2.5 ug/mL — ABNORMAL HIGH (ref 0.0–1.9)
Lamb IgG: 3.1 ug/mL — ABNORMAL HIGH (ref 0.0–1.9)
Oat IgG: 7.3 ug/mL — ABNORMAL HIGH (ref 0.0–1.9)
Onion IgG: 3.7 ug/mL — ABNORMAL HIGH (ref 0.0–1.9)
Peanut IgG: 2.3 ug/mL — ABNORMAL HIGH (ref 0.0–1.9)
Pork IgG: 2.8 ug/mL — ABNORMAL HIGH (ref 0.0–1.9)
Potato, White, IgG: 2.8 ug/mL — ABNORMAL HIGH (ref 0.0–1.9)
Rye IgG: 5 ug/mL — ABNORMAL HIGH (ref 0.0–1.9)
Shrimp IgG: 3.5 ug/mL — ABNORMAL HIGH (ref 0.0–1.9)
Soybean IgG: 3.1 ug/mL — ABNORMAL HIGH (ref 0.0–1.9)
Tomato IgG: 2.8 ug/mL — ABNORMAL HIGH (ref 0.0–1.9)
Wheat IgG: 4.1 ug/mL — ABNORMAL HIGH (ref 0.0–1.9)
Yeast IgG: 2.8 ug/mL — ABNORMAL HIGH (ref 0.0–1.9)

## 2021-12-14 NOTE — Assessment & Plan Note (Signed)
?   Allergic rxn pred taper Depo medrol 80 mg I'm

## 2021-12-14 NOTE — Assessment & Plan Note (Signed)
pred taper Con' mucinex d flonase F/u prn

## 2021-12-14 NOTE — Patient Instructions (Signed)
Otitis Externa  Otitis externa is an infection of the outer ear canal. The outer ear canal is the area between the outside of the ear and the eardrum. Otitis externa is sometimes called swimmer's ear. What are the causes? Common causes of this condition include: Swimming in dirty water. Moisture in the ear. An injury to the inside of the ear. An object stuck in the ear. A cut or scrape on the outside of the ear or in the ear canal. What increases the risk? You are more likely to develop this condition if you go swimming often. What are the signs or symptoms? The first symptom of this condition is often itching in the ear. Later symptoms of the condition include: Swelling of the ear. Redness in the ear. Ear pain. The pain may get worse when you pull on your ear. Pus coming from the ear. How is this diagnosed? This condition may be diagnosed by examining the ear and testing fluid from the ear for bacteria and funguses. How is this treated? This condition may be treated with: Antibiotic ear drops. These are often given for 10-14 days. Medicines to reduce itching and swelling. Follow these instructions at home: If you were prescribed antibiotic ear drops, use them as told by your health care provider. Do not stop using the antibiotic even if you start to feel better. Take over-the-counter and prescription medicines only as told by your health care provider. Avoid getting water in your ears as told by your health care provider. This may include avoiding swimming or water sports for a few days. Keep all follow-up visits. This is important. How is this prevented? Keep your ears dry. Use the corner of a towel to dry your ears after you swim or bathe. Avoid scratching or putting things in your ear. Doing these things can damage the ear canal or remove the protective wax that lines it, which makes it easier for bacteria and funguses to grow. Avoid swimming in lakes, polluted water, or swimming  pools that may not have enough chlorine. Contact a health care provider if: You have a fever. Your ear is still red, swollen, painful, or draining pus after 3 days. Your redness, swelling, or pain gets worse. You have a severe headache. Get help right away if: You have redness, swelling, and pain or tenderness in the area behind your ear. Summary Otitis externa is an infection of the outer ear canal. Common causes include swimming in dirty water, moisture in the ear, or a cut or scrape in the ear. Symptoms include pain, redness, and swelling of the ear canal. If you were prescribed antibiotic ear drops, use them as told by your health care provider. Do not stop using the antibiotic even if you start to feel better. This information is not intended to replace advice given to you by your health care provider. Make sure you discuss any questions you have with your health care provider. Document Revised: 07/03/2020 Document Reviewed: 07/03/2020 Elsevier Patient Education  2023 [employer]  

## 2021-12-31 ENCOUNTER — Encounter: Payer: Self-pay | Admitting: Family Medicine

## 2022-01-01 ENCOUNTER — Other Ambulatory Visit: Payer: Self-pay | Admitting: Family Medicine

## 2022-01-01 DIAGNOSIS — T7840XA Allergy, unspecified, initial encounter: Secondary | ICD-10-CM

## 2022-01-09 LAB — FOOD WITH IGE
Allergen Barley IgE: <0.1 kU/L
Allergen Corn, IgE: <0.1 kU/L
Allergen Oat IgE: <0.1 kU/L
Chocolate/Cacao IgE: <0.1 kU/L
Codfish IgE: <0.1 kU/L
Egg, Whole IgE: <0.1 kU/L
F020-IgE Almond: <0.1 kU/L
F023-IgE Crab: 0.17 kU/L — AB
IgE (Immunoglobulin E), Serum: 255 IU/mL (ref 6–495)
Milk IgE: <0.1 kU/L
Peanut IgE: <0.1 kU/L
Rye IgE: <0.1 kU/L
Shrimp IgE: 0.38 kU/L — AB
Wheat IgE: <0.1 kU/L

## 2022-01-09 LAB — SPECIMEN STATUS REPORT

## 2022-02-05 ENCOUNTER — Ambulatory Visit: Payer: 59 | Admitting: Allergy

## 2022-02-05 NOTE — Progress Notes (Deleted)
New Patient Note  RE: Anna [name] MRN: [id number] DOB: 1981-02-01 Date of Office Visit: 02/05/2022  Consult requested by: Ann Held, * Primary care provider: Carollee Herter, Alferd Apa, DO  Chief Complaint: No chief complaint on file.  History of Present Illness: I had the pleasure of seeing Anna [name] for initial evaluation at the [facility] on 02/05/2022. She is a 41 y.o. female, who is referred here by Carollee Herter, Alferd Apa, DO for the evaluation of ***.  ***  Assessment and Plan: Anna [name] is a 41 y.o. female with: No problem-specific Assessment & Plan notes found for this encounter.  No follow-ups on file.  No orders of the defined types were placed in this encounter.  Lab Orders  No laboratory test(s) ordered today    Other allergy screening: Asthma: {Blank single:19197::"yes","no"} Rhino conjunctivitis: {Blank single:19197::"yes","no"} Food allergy: {Blank single:19197::"yes","no"} Medication allergy: {Blank single:19197::"yes","no"} Hymenoptera allergy: {Blank single:19197::"yes","no"} Urticaria: {Blank single:19197::"yes","no"} Eczema:{Blank single:19197::"yes","no"} History of recurrent infections suggestive of immunodeficency: {Blank single:19197::"yes","no"}  Diagnostics: Spirometry:  Tracings reviewed. Her effort: {Blank single:19197::"Good reproducible efforts.","It was hard to get consistent efforts and there is a question as to whether this reflects a maximal maneuver.","Poor effort, data can not be interpreted."} FVC: ***L FEV1: ***L, ***% predicted FEV1/FVC ratio: ***% Interpretation: {Blank single:19197::"Spirometry consistent with mild obstructive disease","Spirometry consistent with moderate obstructive disease","Spirometry consistent with severe obstructive disease","Spirometry consistent with possible restrictive disease","Spirometry consistent with mixed obstructive and restrictive disease","Spirometry  uninterpretable due to technique","Spirometry consistent with normal pattern","No overt abnormalities noted given today's efforts"}.  Please see scanned spirometry results for details.  Skin Testing: {Blank single:19197::"Select foods","Environmental allergy panel","Environmental allergy panel and select foods","Food allergy panel","None","Deferred due to recent antihistamines use"}. *** Results discussed with patient/family.   Past Medical History: Patient Active Problem List   Diagnosis Date Noted  . Acute swimmer's ear of right side 12/14/2021  . Rash 12/14/2021  . Chronic intractable headache 02/14/2021  . Dizziness 02/14/2021  . Neck pain 02/14/2021  . Fibroadenoma of breast 08/07/2020  . Gastroesophageal reflux disease 08/07/2020  . Herpes simplex 08/07/2020  . Migraine 08/07/2020  . Hematuria 07/05/2020  . Right-sided thoracic back pain 07/05/2020  . Chronic left-sided headaches 02/28/2020  . Postmenopausal state 06/09/2019  . Palpitations 01/03/2019  . Chest pain 01/03/2019  . Stress 01/03/2019  . Fatigue 12/03/2017  . Iron deficiency anemia 12/03/2017  . Vitamin B 12 deficiency 12/03/2017  . Hot flashes 12/03/2017  . Family history of cancer 08/23/2017  . Colicky RUQ abdominal pain 11/23/2014  . Pain of left calf 09/11/2013  . PVC's (premature ventricular contractions) 10/27/2011  . Irritable bowel syndrome 10/14/2011  . Headache(784.0) 10/14/2011  . Knee pain, left 09/23/2010   Past Medical History:  Diagnosis Date  . Allergy   . Anemia   . Blood transfusion without reported diagnosis    at birth  . Excessive weight gain   . GERD (gastroesophageal reflux disease)   . IBS (irritable bowel syndrome)   . Insulin resistance   . Menopause   . Migraine   . Other fatigue   . PVC (premature ventricular contraction)   . Vitamin B deficiency   . Vitamin D deficiency    Past Surgical History: Past Surgical History:  Procedure Laterality Date  . BREAST  EXCISIONAL BIOPSY    . CHOLECYSTECTOMY N/A 09/12/2015   Procedure: LAPAROSCOPIC CHOLECYSTECTOMY;  Surgeon: Rolm Bookbinder, MD;  Location: [facility];  Service: General;  Laterality: N/A;  .  ENDOMETRIAL ABLATION    . RADIOACTIVE SEED GUIDED EXCISIONAL BREAST BIOPSY Right 09/12/2015   Procedure: RADIOACTIVE SEED GUIDED EXCISIONAL RIGHT BREAST BIOPSY;  Surgeon: Rolm Bookbinder, MD;  Location: [facility];  Service: General;  Laterality: Right;  . TUBAL LIGATION  06-04-2002  . WISDOM TOOTH EXTRACTION  2010   Medication List:  Current Outpatient Medications  Medication Sig Dispense Refill  . ALPRAZolam (XANAX) 0.25 MG tablet Take 1 tablet (0.25 mg total) by mouth 2 (two) times daily as needed for anxiety. 20 tablet 0  . ALPRAZolam (XANAX) 0.25 MG tablet Take 1 tablet (0.25 mg total) by mouth 2 (two) times daily as needed for anxiety. 20 tablet 0  . cyanocobalamin (,VITAMIN B-12,) 1000 MCG/ML injection Inject 1 ml or 1000 mcg of Vit B12 into muscle monthly for 1 year 12 mL 1  . famotidine (PEPCID) 20 MG tablet Take 1 tablet (20 mg total) by mouth 2 (two) times daily. 60 tablet 1  . levocetirizine (XYZAL) 5 MG tablet Take 1 tablet (5 mg total) by mouth every evening. 90 tablet 3  . ofloxacin (FLOXIN OTIC) 0.3 % OTIC solution Place 5 drops into the right ear daily. 5 mL 0  . predniSONE (DELTASONE) 10 MG tablet TAKE 3 TABLETS PO QD FOR 3 DAYS THEN TAKE 2 TABLETS PO QD FOR 3 DAYS THEN TAKE 1 TABLET PO QD FOR 3 DAYS THEN TAKE 1/2 TAB PO QD FOR 3 DAYS 20 tablet 0  . valACYclovir (VALTREX) 1000 MG tablet Take 1 tablet (1,000 mg total) by mouth 3 (three) times daily as needed. 30 tablet 2   No current facility-administered medications for this visit.   Allergies: Allergies  Allergen Reactions  . Aciphex [Rabeprazole Sodium]   . Covid-19 (Adenovirus) Vaccine Other (See Comments)    Per patient---Numb throat  . Latex   . Rabeprazole Sodium    Social History: Social  History   Socioeconomic History  . Marital status: Married    Spouse name: Not on file  . Number of children: 7  . Years of education: Not on file  . Highest education level: Not on file  Occupational History  . Occupation: Chief of Staff  Tobacco Use  . Smoking status: Never  . Smokeless tobacco: Never  Vaping Use  . Vaping Use: Never used  Substance and Sexual Activity  . Alcohol use: No  . Drug use: No  . Sexual activity: Yes    Birth control/protection: Surgical  Other Topics Concern  . Not on file  Social History Narrative  . Not on file   Social Determinants of Health   Financial Resource Strain: Not on file  Food Insecurity: Not on file  Transportation Needs: Not on file  Physical Activity: Not on file  Stress: Not on file  Social Connections: Not on file   Lives in a ***. Smoking: *** Occupation: ***  Environmental HistoryFreight forwarder in the house: Estate agent in the family room: {Blank single:19197::"yes","no"} Carpet in the bedroom: {Blank single:19197::"yes","no"} Heating: {Blank single:19197::"electric","gas","heat pump"} Cooling: {Blank single:19197::"central","window","heat pump"} Pet: {Blank single:19197::"yes ***","no"}  Family History: Family History  Problem Relation Age of Onset  . Arthritis Maternal Uncle   . Heart disease Maternal Uncle   . Diabetes Mother   . Hypertension Mother   . Depression Mother   . Heart failure Father   . Arthritis Maternal Grandmother   . Colon cancer Maternal Grandmother   . Hypertension Maternal Grandmother   . Diabetes Maternal Grandmother   .  Ovarian cancer Maternal Aunt   . Prostate cancer Maternal Uncle   . Stroke Maternal Grandfather   . Kidney disease Maternal Grandfather   . Heart disease Maternal Grandfather   . Kidney failure Maternal Grandfather   . Diabetes Maternal Grandfather   . Diabetes Paternal Grandmother   . Bone cancer Paternal  Grandmother   . Breast cancer Paternal Grandmother   . Prostate cancer Maternal Uncle   . Colon cancer Maternal Uncle   . Depression Other   . Esophageal cancer Neg Hx   . Rectal cancer Neg Hx   . Stomach cancer Neg Hx    Problem                               Relation Asthma                                   *** Eczema                                *** Food allergy                          *** Allergic rhino conjunctivitis     ***  Review of Systems  Constitutional:  Negative for appetite change, chills, fever and unexpected weight change.  HENT:  Negative for congestion and rhinorrhea.   Eyes:  Negative for itching.  Respiratory:  Negative for cough, chest tightness, shortness of breath and wheezing.   Cardiovascular:  Negative for chest pain.  Gastrointestinal:  Negative for abdominal pain.  Genitourinary:  Negative for difficulty urinating.  Skin:  Negative for rash.  Neurological:  Negative for headaches.   Objective: There were no vitals taken for this visit. There is no height or weight on file to calculate BMI. Physical Exam Vitals and nursing note reviewed.  Constitutional:      Appearance: Normal appearance. She is well-developed.  HENT:     Head: Normocephalic and atraumatic.     Right Ear: Tympanic membrane and external ear normal.     Left Ear: Tympanic membrane and external ear normal.     Nose: Nose normal.     Mouth/Throat:     Mouth: Mucous membranes are moist.     Pharynx: Oropharynx is clear.  Eyes:     Conjunctiva/sclera: Conjunctivae normal.  Cardiovascular:     Rate and Rhythm: Normal rate and regular rhythm.     Heart sounds: Normal heart sounds. No murmur heard.    No friction rub. No gallop.  Pulmonary:     Effort: Pulmonary effort is normal.     Breath sounds: Normal breath sounds. No wheezing, rhonchi or rales.  Musculoskeletal:     Cervical back: Neck supple.  Skin:    General: Skin is warm.     Findings: No rash.  Neurological:      Mental Status: She is alert and oriented to person, place, and time.  Psychiatric:        Behavior: Behavior normal.  The plan was reviewed with the patient/family, and all questions/concerned were addressed.  It was my pleasure to see Anna [name] today and participate in her care. Please feel free to contact me with any questions or concerns.  Sincerely,  Rexene Alberts, DO Allergy &  Immunology  [facility] office: [place] office: [phone]

## 2022-04-20 ENCOUNTER — Encounter: Payer: Self-pay | Admitting: Nurse Practitioner

## 2022-04-20 ENCOUNTER — Telehealth (INDEPENDENT_AMBULATORY_CARE_PROVIDER_SITE_OTHER): Payer: 59 | Admitting: Nurse Practitioner

## 2022-04-20 VITALS — BP 109/74 | HR 89 | Temp 98.7°F

## 2022-04-20 DIAGNOSIS — U071 COVID-19: Secondary | ICD-10-CM | POA: Insufficient documentation

## 2022-04-20 MED ORDER — ALBUTEROL SULFATE HFA 108 (90 BASE) MCG/ACT IN AERS: 2.0000 | INHALATION_SPRAY | Freq: Four times a day (QID) | RESPIRATORY_TRACT | 0 refills | Status: DC | PRN

## 2022-04-20 NOTE — Progress Notes (Signed)
[facility] [address] [place] [address] Dept: [phone] Dept Fax: [phone]  Virtual Video Visit  I connected with Katherene Ponto on 04/20/22 at 11:20 AM EST by a video enabled telemedicine application and verified that I am speaking with the correct person using two identifiers.  Location patient: Home Location provider: Clinic Persons participating in the virtual visit: Patient; Vance Peper, NP; Daisy Lazar, CMA  I discussed the limitations of evaluation and management by telemedicine and the availability of in person appointments. The patient expressed understanding and agreed to proceed.  Chief Complaint  Patient presents with   Covid Positive    Tested positive on today Sx started yesterday   Cough    Dry cough, causing chest tightness and wheezing Took Mucinex D on yesterday and Robitussin last night    Headache   Generalized Body Aches    SUBJECTIVE:  HPI: Anna [name] is a 41 y.o. female who presents with cough and chest tightness that started yesterday. She had a positive covid-19 test today.   UPPER RESPIRATORY TRACT INFECTION  Fever: no Cough: yes - dry Shortness of breath: no Wheezing: yes Chest pain: no Chest tightness: yes Chest congestion: no Nasal congestion: no Runny nose: no Post nasal drip: no Sneezing: no Sore throat: no Swollen glands: no Sinus pressure: no Headache: yes Face pain: yes Toothache: no Ear pain: no bilateral Ear pressure: no bilateral Eyes red/itching:no Eye drainage/crusting: no  Vomiting: no Rash: no Fatigue: yes Sick contacts: yes Strep contacts: no  Context: stable Recurrent sinusitis: no Relief with OTC cold/cough medications:  a little   Treatments attempted: robitussin, mucinex dm   Patient Active Problem List   Diagnosis Date Noted   COVID-19 04/20/2022   Acute swimmer's ear of right side 12/14/2021   Rash 12/14/2021    Chronic intractable headache 02/14/2021   Dizziness 02/14/2021   Neck pain 02/14/2021   Fibroadenoma of breast 08/07/2020   Gastroesophageal reflux disease 08/07/2020   Herpes simplex 08/07/2020   Migraine 08/07/2020   Hematuria 07/05/2020   Right-sided thoracic back pain 07/05/2020   Chronic left-sided headaches 02/28/2020   Postmenopausal state 06/09/2019   Palpitations 01/03/2019   Chest pain 01/03/2019   Stress 01/03/2019   Fatigue 12/03/2017   Iron deficiency anemia 12/03/2017   Vitamin B 12 deficiency 12/03/2017   Hot flashes 12/03/2017   Family history of cancer 21/22/4825   Colicky RUQ abdominal pain 11/23/2014   Pain of left calf 09/11/2013   PVC's (premature ventricular contractions) 10/27/2011   Irritable bowel syndrome 10/14/2011   Headache(784.0) 10/14/2011   Knee pain, left 09/23/2010    Past Surgical History:  Procedure Laterality Date   BREAST EXCISIONAL BIOPSY     CHOLECYSTECTOMY N/A 09/12/2015   Procedure: LAPAROSCOPIC CHOLECYSTECTOMY;  Surgeon: Rolm Bookbinder, MD;  Location: [facility];  Service: General;  Laterality: N/A;   ENDOMETRIAL ABLATION     RADIOACTIVE SEED GUIDED EXCISIONAL BREAST BIOPSY Right 09/12/2015   Procedure: RADIOACTIVE SEED GUIDED EXCISIONAL RIGHT BREAST BIOPSY;  Surgeon: Rolm Bookbinder, MD;  Location: [facility];  Service: General;  Laterality: Right;   TUBAL LIGATION  06-04-2002   WISDOM TOOTH EXTRACTION  2010    Family History  Problem Relation Age of Onset   Arthritis Maternal Uncle    Heart disease Maternal Uncle    Diabetes Mother    Hypertension Mother    Depression Mother    Heart failure Father    Arthritis  Maternal Grandmother    Colon cancer Maternal Grandmother    Hypertension Maternal Grandmother    Diabetes Maternal Grandmother    Ovarian cancer Maternal Aunt    Prostate cancer Maternal Uncle    Stroke Maternal Grandfather    Kidney disease Maternal Grandfather    Heart disease  Maternal Grandfather    Kidney failure Maternal Grandfather    Diabetes Maternal Grandfather    Diabetes Paternal Grandmother    Bone cancer Paternal Grandmother    Breast cancer Paternal Grandmother    Prostate cancer Maternal Uncle    Colon cancer Maternal Uncle    Depression Other    Esophageal cancer Neg Hx    Rectal cancer Neg Hx    Stomach cancer Neg Hx     Social History   Tobacco Use   Smoking status: Never   Smokeless tobacco: Never  Vaping Use   Vaping Use: Never used  Substance Use Topics   Alcohol use: No   Drug use: No     Current Outpatient Medications:    albuterol (VENTOLIN HFA) 108 (90 Base) MCG/ACT inhaler, Inhale 2 puffs into the lungs every 6 (six) hours as needed for wheezing or shortness of breath., Disp: 8 g, Rfl: 0   ALPRAZolam (XANAX) 0.25 MG tablet, Take 1 tablet (0.25 mg total) by mouth 2 (two) times daily as needed for anxiety., Disp: 20 tablet, Rfl: 0   famotidine (PEPCID) 20 MG tablet, Take 1 tablet (20 mg total) by mouth 2 (two) times daily., Disp: 60 tablet, Rfl: 1   valACYclovir (VALTREX) 1000 MG tablet, Take 1 tablet (1,000 mg total) by mouth 3 (three) times daily as needed., Disp: 30 tablet, Rfl: 2   Vitamin D, Ergocalciferol, (DRISDOL) 1.25 MG (50000 UNIT) CAPS capsule, Take 50,000 Units by mouth once a week., Disp: , Rfl:   Allergies  Allergen Reactions   Rabeprazole Sodium Itching and Other (See Comments)   Covid-19 (Adenovirus) Vaccine Other (See Comments)    Per patient---Numb throat   Latex     Other Reaction(s): Not available   Rabeprazole Sodium     ROS: See pertinent positives and negatives per HPI.  OBSERVATIONS/OBJECTIVE:  VITALS per patient if applicable: Today's Vitals   04/20/22 1039  BP: 109/74  Pulse: 89  Temp: 98.7 F (37.1 C)  TempSrc: Oral  SpO2: 95%   There is no height or weight on file to calculate BMI.    GENERAL: Alert and oriented. Appears well and in no acute distress.  HEENT: Atraumatic.  Conjunctiva clear. No obvious abnormalities on inspection of external nose and ears.  NECK: Normal movements of the head and neck.  LUNGS: On inspection, no signs of respiratory distress. Breathing rate appears normal. No obvious gross SOB, gasping or wheezing, and no conversational dyspnea.  CV: No obvious cyanosis.  MS: Moves all visible extremities without noticeable abnormality.  PSYCH/NEURO: Pleasant and cooperative. No obvious depression or anxiety. Speech and thought processing grossly intact.  ASSESSMENT AND PLAN:  Problem List Items Addressed This Visit       Other   LNLGX-21 - Primary    She has symptoms that started yesterday and positive COVID test this morning.  Will have her start an albuterol inhaler every 6 hours as needed for shortness of breath or wheezing.  She declines Paxlovid for treatment.  She can continue Robitussin Mucinex, drink plenty of fluids.  Reviewed home care instructions for COVID. Advised self-isolation at home for at least 5 days. After 5 days,  if improved and fever resolved, can be in public, but should wear a mask around others for an additional 5 days. If symptoms, esp, dyspnea develops/worsens, recommend in-person evaluation at either an urgent care or the emergency room.         I discussed the assessment and treatment plan with the patient. The patient was provided an opportunity to ask questions and all were answered. The patient agreed with the plan and demonstrated an understanding of the instructions.   The patient was advised to call back or seek an in-person evaluation if the symptoms worsen or if the condition fails to improve as anticipated.   Charyl Dancer, NP

## 2022-04-20 NOTE — Assessment & Plan Note (Signed)
She has symptoms that started yesterday and positive COVID test this morning.  Will have her start an albuterol inhaler every 6 hours as needed for shortness of breath or wheezing.  She declines Paxlovid for treatment.  She can continue Robitussin Mucinex, drink plenty of fluids.  Reviewed home care instructions for COVID. Advised self-isolation at home for at least 5 days. After 5 days, if improved and fever resolved, can be in public, but should wear a mask around others for an additional 5 days. If symptoms, esp, dyspnea develops/worsens, recommend in-person evaluation at either an urgent care or the emergency room.

## 2022-04-20 NOTE — Patient Instructions (Signed)
It was great to see you!  Start albuterol inhaler every 6 hours as needed for shortness of breath or wheezing. Drink plenty of fluids and get rest.   You can continue robitussin and mucinex as needed.   You need to isolate for 5 days and wear a mask for a total of 10 days from when your symptoms started.   Let's follow-up if your symptoms worsen or don't improve.   Take care,  Vance Peper, NP

## 2022-04-21 ENCOUNTER — Encounter: Payer: Self-pay | Admitting: Nurse Practitioner

## 2022-04-21 ENCOUNTER — Other Ambulatory Visit: Payer: Self-pay | Admitting: Family Medicine

## 2022-04-21 DIAGNOSIS — U071 COVID-19: Secondary | ICD-10-CM

## 2022-04-21 MED ORDER — NIRMATRELVIR/RITONAVIR (PAXLOVID)TABLET: 3.0000 | ORAL_TABLET | Freq: Two times a day (BID) | ORAL | 0 refills | Status: AC

## 2022-04-21 MED ORDER — PROMETHAZINE-DM 6.25-15 MG/5ML PO SYRP: 5.0000 mL | ORAL_SOLUTION | Freq: Four times a day (QID) | ORAL | 0 refills | Status: DC | PRN

## 2022-06-25 LAB — HM PAP SMEAR

## 2022-12-07 ENCOUNTER — Encounter: Payer: Self-pay | Admitting: Family Medicine

## 2023-01-18 NOTE — Progress Notes (Signed)
HPI: Fu chest pain. Echocardiogram September 2020 showed normal LV function and trace aortic insufficiency.  Monitor September 2020 showed sinus to sinus tachycardia.  Carotid Dopplers October 2022 showed no significant stenosis though duplex suggesting fibromuscular dysplasia.  CTA October 2022 was normal.  Coronary CTA June 2023 showed calcium score 0 and no coronary disease.  Since last seen she denies dyspnea on exertion, exertional chest pain or syncope.  Occasional palpitations described as a flutter.  Current Outpatient Medications  Medication Sig Dispense Refill   albuterol (VENTOLIN HFA) 108 (90 Base) MCG/ACT inhaler Inhale 2 puffs into the lungs every 6 (six) hours as needed for wheezing or shortness of breath. 8 g 0   ALPRAZolam (XANAX) 0.25 MG tablet Take 1 tablet (0.25 mg total) by mouth 2 (two) times daily as needed for anxiety. 20 tablet 0   valACYclovir (VALTREX) 1000 MG tablet Take 1 tablet (1,000 mg total) by mouth 3 (three) times daily as needed. 30 tablet 2   Vitamin D, Ergocalciferol, (DRISDOL) 1.25 MG (50000 UNIT) CAPS capsule Take 50,000 Units by mouth once a week.     famotidine (PEPCID) 20 MG tablet Take 1 tablet (20 mg total) by mouth 2 (two) times daily. (Patient not taking: Reported on 02/01/2023) 60 tablet 1   promethazine-dextromethorphan (PROMETHAZINE-DM) 6.25-15 MG/5ML syrup Take 5 mLs by mouth 4 (four) times daily as needed. (Patient not taking: Reported on 02/01/2023) 118 mL 0   No current facility-administered medications for this visit.     Past Medical History:  Diagnosis Date   Allergy    Anemia    Blood transfusion without reported diagnosis    at birth   Excessive weight gain    GERD (gastroesophageal reflux disease)    IBS (irritable bowel syndrome)    Insulin resistance    Menopause    Migraine    Other fatigue    PVC (premature ventricular contraction)    Vitamin B deficiency    Vitamin D deficiency     Past Surgical History:   Procedure Laterality Date   BREAST EXCISIONAL BIOPSY     CHOLECYSTECTOMY N/A 09/12/2015   Procedure: LAPAROSCOPIC CHOLECYSTECTOMY;  Surgeon: Emelia Loron, MD;  Location: [facility];  Service: General;  Laterality: N/A;   ENDOMETRIAL ABLATION     RADIOACTIVE SEED GUIDED EXCISIONAL BREAST BIOPSY Right 09/12/2015   Procedure: RADIOACTIVE SEED GUIDED EXCISIONAL RIGHT BREAST BIOPSY;  Surgeon: Emelia Loron, MD;  Location: [facility];  Service: General;  Laterality: Right;   TUBAL LIGATION  06-04-2002   WISDOM TOOTH EXTRACTION  2010    Social History   Socioeconomic History   Marital status: Married    Spouse name: Not on file   Number of children: 7   Years of education: Not on file   Highest education level: Not on file  Occupational History   Occupation: Hospital doctor  Tobacco Use   Smoking status: Never   Smokeless tobacco: Never  Vaping Use   Vaping status: Never Used  Substance and Sexual Activity   Alcohol use: No   Drug use: No   Sexual activity: Yes    Birth control/protection: Surgical  Other Topics Concern   Not on file  Social History Narrative   Not on file   Social Determinants of Health   Financial Resource Strain: Not on file  Food Insecurity: Not on file  Transportation Needs: Not on file  Physical Activity: Not on file  Stress: Not on file  Social Connections: Not on file  Intimate Partner Violence: Not on file    Family History  Problem Relation Age of Onset   Arthritis Maternal Uncle    Heart disease Maternal Uncle    Diabetes Mother    Hypertension Mother    Depression Mother    Heart failure Father    Arthritis Maternal Grandmother    Colon cancer Maternal Grandmother    Hypertension Maternal Grandmother    Diabetes Maternal Grandmother    Ovarian cancer Maternal Aunt    Prostate cancer Maternal Uncle    Stroke Maternal Grandfather    Kidney disease Maternal Grandfather    Heart disease  Maternal Grandfather    Kidney failure Maternal Grandfather    Diabetes Maternal Grandfather    Diabetes Paternal Grandmother    Bone cancer Paternal Grandmother    Breast cancer Paternal Grandmother    Prostate cancer Maternal Uncle    Colon cancer Maternal Uncle    Depression Other    Esophageal cancer Neg Hx    Rectal cancer Neg Hx    Stomach cancer Neg Hx     ROS: no fevers or chills, productive cough, hemoptysis, dysphasia, odynophagia, melena, hematochezia, dysuria, hematuria, rash, seizure activity, orthopnea, PND, pedal edema, claudication. Remaining systems are negative.  Physical Exam: Well-developed well-nourished in no acute distress.  Skin is warm and dry.  HEENT is normal.  Neck is supple.  Chest is clear to auscultation with normal expansion.  Cardiovascular exam is regular rate and rhythm.  Abdominal exam nontender or distended. No masses palpated. Extremities show no edema. neuro grossly intact  EKG Interpretation Date/Time:  Monday February 01 2023 15:17:49 EDT Ventricular Rate:  83 PR Interval:  146 QRS Duration:  72 QT Interval:  326 QTC Calculation: 383 R Axis:   89  Text Interpretation: Normal sinus rhythm Nonspecific ST and T wave abnormality When compared with ECG of 24-Oct-2011 14:15, No significant change was found Confirmed by Olga Millers ([id number]) on 02/01/2023 3:20:44 PM    A/P  1 chest pain-coronary CTA showed no coronary disease.  No recurrences.  2 PVCs-she occasionally feels palpitations but symptoms are reasonable.  Will consider addition of beta-blockade in the future if necessary.  Olga Millers, MD

## 2023-02-01 ENCOUNTER — Ambulatory Visit: Payer: 59 | Admitting: Cardiology

## 2023-02-01 ENCOUNTER — Encounter: Payer: Self-pay | Admitting: Cardiology

## 2023-02-01 VITALS — BP 124/62 | HR 83 | Ht 63.0 in | Wt 178.1 lb

## 2023-02-01 DIAGNOSIS — R002 Palpitations: Secondary | ICD-10-CM | POA: Diagnosis not present

## 2023-02-01 DIAGNOSIS — Z09 Encounter for follow-up examination after completed treatment for conditions other than malignant neoplasm: Secondary | ICD-10-CM | POA: Diagnosis not present

## 2023-02-01 DIAGNOSIS — R072 Precordial pain: Secondary | ICD-10-CM

## 2023-02-01 DIAGNOSIS — I493 Ventricular premature depolarization: Secondary | ICD-10-CM

## 2023-02-01 NOTE — Patient Instructions (Signed)

## 2023-02-19 ENCOUNTER — Ambulatory Visit: Payer: 59 | Admitting: Family Medicine

## 2023-02-19 ENCOUNTER — Encounter: Payer: Self-pay | Admitting: Family Medicine

## 2023-02-19 DIAGNOSIS — E538 Deficiency of other specified B group vitamins: Secondary | ICD-10-CM | POA: Diagnosis not present

## 2023-02-19 DIAGNOSIS — R7303 Prediabetes: Secondary | ICD-10-CM | POA: Insufficient documentation

## 2023-02-19 LAB — CBC WITH DIFFERENTIAL/PLATELET
Basophils Absolute: 0 10*3/uL (ref 0.0–0.1)
Basophils Relative: 0.4 % (ref 0.0–3.0)
Eosinophils Absolute: 0.2 10*3/uL (ref 0.0–0.7)
Eosinophils Relative: 6.5 % — ABNORMAL HIGH (ref 0.0–5.0)
HCT: 39.9 % (ref 36.0–46.0)
Hemoglobin: 12.8 g/dL (ref 12.0–15.0)
Lymphocytes Relative: 48.8 % — ABNORMAL HIGH (ref 12.0–46.0)
Lymphs Abs: 1.5 10*3/uL (ref 0.7–4.0)
MCHC: 32.1 g/dL (ref 30.0–36.0)
MCV: 87.6 fL (ref 78.0–100.0)
Monocytes Absolute: 0.2 10*3/uL (ref 0.1–1.0)
Monocytes Relative: 5.7 % (ref 3.0–12.0)
Neutro Abs: 1.2 10*3/uL — ABNORMAL LOW (ref 1.4–7.7)
Neutrophils Relative %: 38.6 % — ABNORMAL LOW (ref 43.0–77.0)
Platelets: 222 10*3/uL (ref 150.0–400.0)
RBC: 4.56 Mil/uL (ref 3.87–5.11)
RDW: 14 % (ref 11.5–15.5)
WBC: 3.1 10*3/uL — ABNORMAL LOW (ref 4.0–10.5)

## 2023-02-19 LAB — COMPREHENSIVE METABOLIC PANEL
ALT: 12 U/L (ref 0–35)
AST: 18 U/L (ref 0–37)
Albumin: 4.2 g/dL (ref 3.5–5.2)
Alkaline Phosphatase: 60 U/L (ref 39–117)
BUN: 14 mg/dL (ref 6–23)
CO2: 33 meq/L — ABNORMAL HIGH (ref 19–32)
Calcium: 9.7 mg/dL (ref 8.4–10.5)
Chloride: 102 meq/L (ref 96–112)
Creatinine, Ser: 0.73 mg/dL (ref 0.40–1.20)
GFR: 100.31 mL/min (ref >60.00)
Glucose, Bld: 92 mg/dL (ref 70–99)
Potassium: 4.1 meq/L (ref 3.5–5.1)
Sodium: 142 meq/L (ref 135–145)
Total Bilirubin: 0.3 mg/dL (ref 0.2–1.2)
Total Protein: 6.7 g/dL (ref 6.0–8.3)

## 2023-02-19 LAB — TSH: TSH: 1.17 u[IU]/mL (ref 0.35–5.50)

## 2023-02-19 LAB — LIPID PANEL
Cholesterol: 147 mg/dL (ref 0–200)
HDL: 60.5 mg/dL (ref >39.00)
LDL Cholesterol: 76 mg/dL (ref 0–99)
NonHDL: 86.16
Total CHOL/HDL Ratio: 2
Triglycerides: 52 mg/dL (ref 0.0–149.0)
VLDL: 10.4 mg/dL (ref 0.0–40.0)

## 2023-02-19 LAB — VITAMIN D 25 HYDROXY (VIT D DEFICIENCY, FRACTURES): VITD: 15.94 ng/mL — ABNORMAL LOW (ref 30.00–100.00)

## 2023-02-19 LAB — VITAMIN B12: Vitamin B-12: 307 pg/mL (ref 211–911)

## 2023-02-19 LAB — HEMOGLOBIN A1C: Hgb A1c MFr Bld: 6.2 % (ref 4.6–6.5)

## 2023-02-19 MED ORDER — WEGOVY 0.25 MG/0.5ML ~~LOC~~ SOAJ: 0.2500 mg | SUBCUTANEOUS | 0 refills | Status: DC

## 2023-02-19 MED ORDER — ZEPBOUND 2.5 MG/0.5ML ~~LOC~~ SOAJ: 2.5000 mg | SUBCUTANEOUS | 0 refills | Status: DC

## 2023-02-19 MED ORDER — TIRZEPATIDE 2.5 MG/0.5ML ~~LOC~~ SOAJ: 2.5000 mg | SUBCUTANEOUS | 0 refills | Status: DC

## 2023-02-19 NOTE — Addendum Note (Signed)
Addended by: Seabron Spates R on: 02/19/2023 04:09 PM   Modules accepted: Orders

## 2023-02-19 NOTE — Assessment & Plan Note (Signed)
Con't b12 monthly injections

## 2023-02-19 NOTE — Assessment & Plan Note (Signed)
Watch simple sugars and starches Zepbound 2.5 mg weekly  Increase to 5 mg in a month  F/u 3 months

## 2023-02-19 NOTE — Progress Notes (Signed)
Established Patient Office Visit  Subjective   Patient ID: Anna [name], female    DOB: 09-Dec-1980  Age: 42 y.o. MRN: [id number]  Chief Complaint  Patient presents with   Follow-up    HPI Pt is here with her husband to discuss repeating labs and her frustration with not being able to lose weight. She also c/o grinding her teeth-- causing headaches.  History of Present Illness          Patient Active Problem List   Diagnosis Date Noted   Morbid obesity (HCC) 02/19/2023   Prediabetes 02/19/2023   COVID-19 04/20/2022   Acute swimmer's ear of right side 12/14/2021   Rash 12/14/2021   Chronic intractable headache 02/14/2021   Dizziness 02/14/2021   Neck pain 02/14/2021   Fibroadenoma of breast 08/07/2020   Gastroesophageal reflux disease 08/07/2020   Herpes simplex 08/07/2020   Migraine 08/07/2020   Hematuria 07/05/2020   Right-sided thoracic back pain 07/05/2020   Chronic left-sided headaches 02/28/2020   Postmenopausal state 06/09/2019   Palpitations 01/03/2019   Chest pain 01/03/2019   Stress 01/03/2019   Fatigue 12/03/2017   Iron deficiency anemia 12/03/2017   Vitamin B 12 deficiency 12/03/2017   Hot flashes 12/03/2017   Family history of cancer 08/23/2017   Colicky RUQ abdominal pain 11/23/2014   Pain of left calf 09/11/2013   PVC's (premature ventricular contractions) 10/27/2011   Irritable bowel syndrome 10/14/2011   Headache 10/14/2011   Knee pain, left 09/23/2010   Past Medical History:  Diagnosis Date   Allergy    Anemia    Blood transfusion without reported diagnosis    at birth   Excessive weight gain    GERD (gastroesophageal reflux disease)    IBS (irritable bowel syndrome)    Insulin resistance    Menopause    Migraine    Other fatigue    PVC (premature ventricular contraction)    Vitamin B deficiency    Vitamin D deficiency    Past Surgical History:  Procedure Laterality Date   BREAST EXCISIONAL BIOPSY     CHOLECYSTECTOMY N/A  09/12/2015   Procedure: LAPAROSCOPIC CHOLECYSTECTOMY;  Surgeon: Emelia Loron, MD;  Location: [facility];  Service: General;  Laterality: N/A;   ENDOMETRIAL ABLATION     RADIOACTIVE SEED GUIDED EXCISIONAL BREAST BIOPSY Right 09/12/2015   Procedure: RADIOACTIVE SEED GUIDED EXCISIONAL RIGHT BREAST BIOPSY;  Surgeon: Emelia Loron, MD;  Location:  SURGERY CENTER;  Service: General;  Laterality: Right;   TUBAL LIGATION  06-04-2002   WISDOM TOOTH EXTRACTION  2010   Social History   Tobacco Use   Smoking status: Never   Smokeless tobacco: Never  Vaping Use   Vaping status: Never Used  Substance Use Topics   Alcohol use: No   Drug use: No   Social History   Socioeconomic History   Marital status: Married    Spouse name: Not on file   Number of children: 7   Years of education: Not on file   Highest education level: Not on file  Occupational History   Occupation: Hospital doctor  Tobacco Use   Smoking status: Never   Smokeless tobacco: Never  Vaping Use   Vaping status: Never Used  Substance and Sexual Activity   Alcohol use: No   Drug use: No   Sexual activity: Yes    Birth control/protection: Surgical  Other Topics Concern   Not on file  Social History Narrative   Not on file  Social Determinants of Health   Financial Resource Strain: Not on file  Food Insecurity: Not on file  Transportation Needs: Not on file  Physical Activity: Not on file  Stress: Not on file  Social Connections: Not on file  Intimate Partner Violence: Not on file   Family Status  Relation Name Status   Mat Uncle  Deceased   Mother  Alive   Father  Deceased       Murdered   MGM  (Not Specified)   Mat Aunt  (Not Specified)   Mat Uncle  (Not Specified)   MGF  (Not Specified)   PGM  (Not Specified)   Mat Uncle  (Not Specified)   Other  (Not Specified)   Neg Hx  (Not Specified)  No partnership data on file   Family History  Problem Relation Age of  Onset   Arthritis Maternal Uncle    Heart disease Maternal Uncle    Diabetes Mother    Hypertension Mother    Depression Mother    Heart failure Father    Arthritis Maternal Grandmother    Colon cancer Maternal Grandmother    Hypertension Maternal Grandmother    Diabetes Maternal Grandmother    Ovarian cancer Maternal Aunt    Prostate cancer Maternal Uncle    Stroke Maternal Grandfather    Kidney disease Maternal Grandfather    Heart disease Maternal Grandfather    Kidney failure Maternal Grandfather    Diabetes Maternal Grandfather    Diabetes Paternal Grandmother    Bone cancer Paternal Grandmother    Breast cancer Paternal Grandmother    Prostate cancer Maternal Uncle    Colon cancer Maternal Uncle    Depression Other    Esophageal cancer Neg Hx    Rectal cancer Neg Hx    Stomach cancer Neg Hx    Allergies  Allergen Reactions   Rabeprazole Sodium Itching and Other (See Comments)   Covid-19 (Adenovirus) Vaccine Other (See Comments)    Per patient---Numb throat   Latex     Other Reaction(s): Not available   Rabeprazole Sodium       Review of Systems  Constitutional:  Negative for chills, fever and malaise/fatigue.  HENT:  Negative for congestion and hearing loss.   Eyes:  Negative for blurred vision and discharge.  Respiratory:  Negative for cough, sputum production and shortness of breath.   Cardiovascular:  Negative for chest pain, palpitations and leg swelling.  Gastrointestinal:  Negative for abdominal pain, blood in stool, constipation, diarrhea, heartburn, nausea and vomiting.  Genitourinary:  Negative for dysuria, frequency, hematuria and urgency.  Musculoskeletal:  Negative for back pain, falls and myalgias.  Skin:  Negative for rash.  Neurological:  Positive for headaches. Negative for dizziness, sensory change, loss of consciousness and weakness.  Endo/Heme/Allergies:  Negative for environmental allergies. Does not bruise/bleed easily.   Psychiatric/Behavioral:  Negative for depression and suicidal ideas. The patient is not nervous/anxious and does not have insomnia.       Objective:     BP 108/78 (BP Location: Right Arm, Patient Position: Sitting, Cuff Size: Normal)   Pulse 69   Temp 98.5 F (36.9 C) (Oral)   Resp 18   Ht 5\' 3"  (1.6 m)   Wt 175 lb (79.4 kg)   SpO2 98%   BMI 31.00 kg/m  BP Readings from Last 3 Encounters:  02/19/23 108/78  02/01/23 124/62  04/20/22 109/74   Wt Readings from Last 3 Encounters:  02/19/23 175 lb (79.4 kg)  02/01/23 178 lb 1.9 oz (80.8 kg)  12/12/21 171 lb 3.2 oz (77.7 kg)   SpO2 Readings from Last 3 Encounters:  02/19/23 98%  02/01/23 97%  04/20/22 95%      Physical Exam Vitals and nursing note reviewed.  Constitutional:      General: She is not in acute distress.    Appearance: Normal appearance. She is well-developed.  HENT:     Head: Normocephalic and atraumatic.     Right Ear: Tympanic membrane, ear canal and external ear normal. There is no impacted cerumen.     Left Ear: Tympanic membrane, ear canal and external ear normal. There is no impacted cerumen.     Nose: Nose normal.     Mouth/Throat:     Mouth: Mucous membranes are moist.     Pharynx: Oropharynx is clear. No oropharyngeal exudate or posterior oropharyngeal erythema.  Eyes:     General: No scleral icterus.       Right eye: No discharge.        Left eye: No discharge.     Conjunctiva/sclera: Conjunctivae normal.     Pupils: Pupils are equal, round, and reactive to light.  Neck:     Thyroid: No thyromegaly or thyroid tenderness.     Vascular: No JVD.  Cardiovascular:     Rate and Rhythm: Normal rate and regular rhythm.     Heart sounds: Normal heart sounds. No murmur heard. Pulmonary:     Effort: Pulmonary effort is normal. No respiratory distress.     Breath sounds: Normal breath sounds.  Abdominal:     General: Bowel sounds are normal. There is no distension.     Palpations: Abdomen is  soft. There is no mass.     Tenderness: There is no abdominal tenderness. There is no guarding or rebound.  Genitourinary:    Vagina: Normal.  Musculoskeletal:        General: Normal range of motion.     Cervical back: Normal range of motion and neck supple.     Right lower leg: No edema.     Left lower leg: No edema.  Lymphadenopathy:     Cervical: No cervical adenopathy.  Skin:    General: Skin is warm and dry.     Findings: No erythema or rash.  Neurological:     Mental Status: She is alert and oriented to person, place, and time.     Cranial Nerves: No cranial nerve deficit.     Deep Tendon Reflexes: Reflexes are normal and symmetric.  Psychiatric:        Mood and Affect: Mood normal.        Behavior: Behavior normal.        Thought Content: Thought content normal.        Judgment: Judgment normal.      Results for orders placed or performed in visit on 02/19/23  HM PAP SMEAR  Result Value Ref Range   HM Pap smear per patient     Last CBC Lab Results  Component Value Date   WBC 3.0 (L) 02/14/2021   HGB 12.3 02/14/2021   HCT 37.3 02/14/2021   MCV 85.5 02/14/2021   MCH 28.5 08/07/2020   RDW 14.4 02/14/2021   PLT 214.0 02/14/2021   Last metabolic panel Lab Results  Component Value Date   GLUCOSE 103 (H) 02/14/2021   NA 139 02/14/2021   K 4.3 02/14/2021   CL 101 02/14/2021   CO2 33 (H) 02/14/2021  BUN 13 02/14/2021   CREATININE 0.75 02/14/2021   GFR 98.49 02/14/2021   CALCIUM 10.0 02/14/2021   PROT 7.0 02/14/2021   ALBUMIN 4.4 02/14/2021   LABGLOB 2.8 08/07/2020   AGRATIO 1.6 08/07/2020   BILITOT 0.4 02/14/2021   ALKPHOS 70 02/14/2021   AST 17 02/14/2021   ALT 10 02/14/2021   ANIONGAP 10 01/17/2019   Last lipids Lab Results  Component Value Date   CHOL 145 02/14/2021   HDL 55.60 02/14/2021   LDLCALC 78 02/14/2021   TRIG 57.0 02/14/2021   CHOLHDL 3 02/14/2021   Last hemoglobin A1c Lab Results  Component Value Date   HGBA1C 6.0 (H)  08/07/2020   Last thyroid functions Lab Results  Component Value Date   TSH 1.27 02/14/2021   T3TOTAL 156 08/07/2020   T4TOTAL 9.2 08/07/2020   Last vitamin D Lab Results  Component Value Date   VD25OH 23.6 (L) 08/07/2020   Last vitamin B12 and Folate Lab Results  Component Value Date   VITAMINB12 206 (L) 02/14/2021   FOLATE 10.2 08/07/2020      The 10-year ASCVD risk score (Arnett DK, et al., 2019) is: 0.3%    Assessment & Plan:   Problem List Items Addressed This Visit       Unprioritized   Vitamin B 12 deficiency    Con't b12 monthly injections      Prediabetes    Watch simple sugars and starches Zepbound 2.5 mg weekly  Increase to 5 mg in a month  F/u 3 months       Relevant Medications   tirzepatide (ZEPBOUND) 2.5 MG/0.5ML Pen   Other Relevant Orders   Hemoglobin A1c   Lipid panel   CBC with Differential/Platelet   Comprehensive metabolic panel   Insulin, random   TSH   Vitamin B12   VITAMIN D 25 Hydroxy (Vit-D Deficiency, Fractures)   Morbid obesity (HCC) - Primary   Relevant Medications   tirzepatide (ZEPBOUND) 2.5 MG/0.5ML Pen   Other Relevant Orders   Hemoglobin A1c   Lipid panel   CBC with Differential/Platelet   Comprehensive metabolic panel   Insulin, random   TSH   Vitamin B12   VITAMIN D 25 Hydroxy (Vit-D Deficiency, Fractures)               No follow-ups on file.    Donato Schultz, DO

## 2023-02-19 NOTE — Addendum Note (Signed)
Addended by: Seabron Spates R on: 02/19/2023 12:57 PM   Modules accepted: Orders

## 2023-02-22 LAB — INSULIN, RANDOM: Insulin: 5.3 u[IU]/mL

## 2023-02-25 ENCOUNTER — Other Ambulatory Visit: Payer: Self-pay | Admitting: Family Medicine

## 2023-02-26 ENCOUNTER — Other Ambulatory Visit (HOSPITAL_COMMUNITY): Payer: Self-pay

## 2023-02-26 ENCOUNTER — Encounter: Payer: Self-pay | Admitting: Family Medicine

## 2023-02-26 MED ORDER — VITAMIN D (ERGOCALCIFEROL) 1.25 MG (50000 UNIT) PO CAPS: 50000.0000 [IU] | ORAL_CAPSULE | ORAL | 1 refills | Status: AC

## 2023-03-02 ENCOUNTER — Other Ambulatory Visit (HOSPITAL_COMMUNITY): Payer: Self-pay

## 2023-03-02 ENCOUNTER — Telehealth: Payer: Self-pay

## 2023-03-02 NOTE — Telephone Encounter (Signed)
*  Primary  Prior Authorization form/request asks a question that requires your assistance. Please see the question below and advise accordingly.   Patients insurance is showing possible alternative information while trying to process PA for [facility]. Patient may have alternative date of birth, zipcode with their plan. Please have patient verify information with their plan.   CMM Key: [id number]

## 2023-03-02 NOTE — Telephone Encounter (Signed)
PA request has been Submitted. New Encounter created for follow up. For additional info see Pharmacy Prior Auth telephone encounter from 10/29.

## 2023-03-09 NOTE — Telephone Encounter (Signed)
This is the information that was presented to the plan and they are showing alternative information. This information could be a spelling error in a name or an old zip code/phone number. Patient will have to contact plan to correct.

## 2023-03-09 NOTE — Telephone Encounter (Signed)
Per Pt  Her DOB Mar 21, 1981 and zip code [address]

## 2023-03-10 NOTE — Telephone Encounter (Signed)
Spoke with patient. Pt states the portal is showing her address where they process claims. Pt gave me the zip code [address]. Pt states her husband will call to have the address updated.

## 2023-03-16 ENCOUNTER — Telehealth: Payer: Self-pay | Admitting: Family Medicine

## 2023-03-16 NOTE — Telephone Encounter (Signed)
Pt states the zip code has been updated. Please try prior auth again.

## 2023-03-16 NOTE — Telephone Encounter (Signed)
Anna [name] with [employer], from the prior auth dept, called and stated that they will starting another prior auth for mounjaro because " she's not seeing that it was completed multiple times". She provided the starter key which is [id number]. Please advise at [phone].

## 2023-03-19 ENCOUNTER — Other Ambulatory Visit (HOSPITAL_COMMUNITY): Payer: Self-pay

## 2023-03-19 NOTE — Telephone Encounter (Signed)
ERROR-- to close 

## 2023-03-19 NOTE — Telephone Encounter (Signed)
Anna [name] is only approved for type 2 diabetes. No indication of diabetes on patient's chart. PA will be denied for any diagnosis other than type 2 diabetes.

## 2023-03-22 ENCOUNTER — Other Ambulatory Visit: Payer: Self-pay | Admitting: Family Medicine

## 2023-03-22 ENCOUNTER — Telehealth: Payer: Self-pay

## 2023-03-22 ENCOUNTER — Other Ambulatory Visit: Payer: Self-pay

## 2023-03-22 ENCOUNTER — Other Ambulatory Visit (HOSPITAL_COMMUNITY): Payer: Self-pay

## 2023-03-22 DIAGNOSIS — R7303 Prediabetes: Secondary | ICD-10-CM

## 2023-03-22 MED ORDER — WEGOVY 0.25 MG/0.5ML ~~LOC~~ SOAJ: 0.2500 mg | SUBCUTANEOUS | 0 refills | Status: DC

## 2023-03-22 NOTE — Telephone Encounter (Signed)
Pharmacy Patient Advocate Encounter   Received notification from Physician's Office that prior authorization for [facility] is required/requested.   Insurance verification completed.   The patient is insured through [facility] .   Per test claim: PA required; PA submitted to above mentioned insurance via CoverMyMeds Key/confirmation #/EOC Key: [id number]  Status is pending

## 2023-03-22 NOTE — Telephone Encounter (Signed)
Will you be trying another medication since patient is not diabetic?

## 2023-03-24 NOTE — Telephone Encounter (Signed)
Pharmacy Patient Advocate Encounter  Received notification from [facility] that Prior Authorization for [facility] has been DENIED.  Full denial letter will be uploaded to the media tab. See denial reason below.  Reginal Lutes is not a covered medication and is excluded from the pts plan

## 2023-03-29 NOTE — Telephone Encounter (Signed)
Mychart message to sent patient

## 2023-04-13 ENCOUNTER — Telehealth: Payer: Self-pay | Admitting: *Deleted

## 2023-04-13 NOTE — Assessment & Plan Note (Signed)
We will try to get zepound, mounjaro  or wegovy/ ozempic for the pt  Labs pending

## 2023-04-13 NOTE — Telephone Encounter (Signed)
Prior auth did not get ran through because pt did not have type 2 diabetes.  Dr. Laury Axon requested that PA be ran through with prediabetes.  Prior auth started via cover my meds.  Awaiting determination.  Key: [id number]

## 2023-04-14 NOTE — Telephone Encounter (Signed)
Prior auth denied must be type 2 diabetic.

## 2023-05-20 ENCOUNTER — Other Ambulatory Visit: Payer: Self-pay | Admitting: Obstetrics and Gynecology

## 2023-05-20 DIAGNOSIS — Z1231 Encounter for screening mammogram for malignant neoplasm of breast: Secondary | ICD-10-CM

## 2023-06-26 ENCOUNTER — Encounter: Payer: Self-pay | Admitting: Family Medicine

## 2023-06-28 ENCOUNTER — Other Ambulatory Visit: Payer: Self-pay | Admitting: Family Medicine

## 2023-06-28 DIAGNOSIS — E538 Deficiency of other specified B group vitamins: Secondary | ICD-10-CM

## 2023-07-01 ENCOUNTER — Ambulatory Visit: Payer: 59

## 2023-07-01 ENCOUNTER — Other Ambulatory Visit (INDEPENDENT_AMBULATORY_CARE_PROVIDER_SITE_OTHER): Payer: 59

## 2023-07-01 ENCOUNTER — Ambulatory Visit: Payer: 59 | Admitting: Family Medicine

## 2023-07-01 DIAGNOSIS — E538 Deficiency of other specified B group vitamins: Secondary | ICD-10-CM

## 2023-07-01 LAB — CBC WITH DIFFERENTIAL/PLATELET
Basophils Absolute: 0 10*3/uL (ref 0.0–0.1)
Basophils Relative: 0.9 % (ref 0.0–3.0)
Eosinophils Absolute: 0.1 10*3/uL (ref 0.0–0.7)
Eosinophils Relative: 3 % (ref 0.0–5.0)
HCT: 38.1 % (ref 36.0–46.0)
Hemoglobin: 12.5 g/dL (ref 12.0–15.0)
Lymphocytes Relative: 49 % — ABNORMAL HIGH (ref 12.0–46.0)
Lymphs Abs: 1.4 10*3/uL (ref 0.7–4.0)
MCHC: 32.9 g/dL (ref 30.0–36.0)
MCV: 87.9 fl (ref 78.0–100.0)
Monocytes Absolute: 0.2 10*3/uL (ref 0.1–1.0)
Monocytes Relative: 6 % (ref 3.0–12.0)
Neutro Abs: 1.2 10*3/uL — ABNORMAL LOW (ref 1.4–7.7)
Neutrophils Relative %: 41.1 % — ABNORMAL LOW (ref 43.0–77.0)
Platelets: 253 10*3/uL (ref 150.0–400.0)
RBC: 4.33 Mil/uL (ref 3.87–5.11)
RDW: 13.9 % (ref 11.5–15.5)
WBC: 2.9 10*3/uL — ABNORMAL LOW (ref 4.0–10.5)

## 2023-07-01 LAB — VITAMIN B12: Vitamin B-12: 430 pg/mL (ref 211–911)

## 2023-07-02 LAB — HOMOCYSTEINE: Homocysteine: 7.5 umol/L (ref <10.4)

## 2023-07-02 LAB — RETICULOCYTES

## 2023-07-02 LAB — EXTRA SPECIMEN

## 2023-07-08 ENCOUNTER — Ambulatory Visit
Admission: RE | Admit: 2023-07-08 | Discharge: 2023-07-08 | Disposition: A | Discharge status: Home / Self Care | Payer: 59 | Source: Ambulatory Visit | Attending: Obstetrics and Gynecology | Admitting: Obstetrics and Gynecology

## 2023-07-08 DIAGNOSIS — Z1231 Encounter for screening mammogram for malignant neoplasm of breast: Secondary | ICD-10-CM

## 2024-02-29 ENCOUNTER — Ambulatory Visit: Admitting: Family Medicine

## 2024-02-29 VITALS — BP 117/78 | HR 70 | Temp 98.1°F | Resp 12 | Ht 63.0 in | Wt 175.8 lb

## 2024-02-29 DIAGNOSIS — S0300XA Dislocation of jaw, unspecified side, initial encounter: Secondary | ICD-10-CM | POA: Diagnosis not present

## 2024-02-29 DIAGNOSIS — R0683 Snoring: Secondary | ICD-10-CM

## 2024-02-29 DIAGNOSIS — F439 Reaction to severe stress, unspecified: Secondary | ICD-10-CM | POA: Diagnosis not present

## 2024-02-29 DIAGNOSIS — N94819 Vulvodynia, unspecified: Secondary | ICD-10-CM

## 2024-02-29 MED ORDER — ALPRAZOLAM 0.25 MG PO TABS: 0.2500 mg | ORAL_TABLET | Freq: Every day | ORAL | 1 refills | Status: AC

## 2024-02-29 MED ORDER — METHOCARBAMOL 500 MG PO TABS: 500.0000 mg | ORAL_TABLET | Freq: Four times a day (QID) | ORAL | 2 refills | Status: AC

## 2024-02-29 NOTE — Progress Notes (Signed)
 Subjective:    Patient ID: Anna [name], female    DOB: Oct 24, 1980, 43 y.o.   MRN: [id number]  Chief Complaint  Patient presents with   Snoring   feels like something in nose    HPI Patient is in today for c/o snoring and tmj.  Discussed the use of AI scribe software for clinical note transcription with the patient, who gave verbal consent to proceed.  History of Present Illness Anna [name] is a 43 year old female who presents with jaw pain, snoring, and sleep disturbances. She was accompanied by her partner, Verdel.  She experiences jaw pain associated with clenching her teeth during the day and wearing a night guard at night. Previous attempts to manage this with cyclobenzaprine  and meloxicam  were unsuccessful due to intolerable side effects. She wakes up with soreness, particularly when using her night guard. She reports neck, shoulder, and jaw pain.  She reports snoring that began after receiving the COVID vaccine, accompanied by a sensation of fluid in her throat and a 'sizzling sensation' in her ears at night. She uses Flonase for nasal congestion, which she attributes to enlarged nasal turbinates, and has experienced some relief with Aquaseltzer. Loud snoring is present.  She experiences vulvodynia, which started about a year ago, characterized by a lack of sensation on the left side during exams and intermittent sciatic pain radiating down her leg. She has tried Dentist, which caused blurred vision, and was prescribed gabapentin, which she has not taken due to concerns about side effects. Certain foods trigger her symptoms, and she has seen improvement by avoiding these triggers. She has a history of bladder pain, which has improved with the use of coconut oil. She has seen a vulva specialist at [facility], who told her that nerve damage from childbirth could be a possible cause, and she has undergone pelvic floor therapy without significant improvement.     Past Medical  History:  Diagnosis Date   Allergy    Anemia    Blood transfusion without reported diagnosis    at birth   Excessive weight gain    GERD (gastroesophageal reflux disease)    IBS (irritable bowel syndrome)    Insulin  resistance    Menopause    Migraine    Other fatigue    PVC (premature ventricular contraction)    Vitamin B deficiency    Vitamin D  deficiency     Past Surgical History:  Procedure Laterality Date   BREAST EXCISIONAL BIOPSY     CHOLECYSTECTOMY N/A 09/12/2015   Procedure: LAPAROSCOPIC CHOLECYSTECTOMY;  Surgeon: Donnice Bury, MD;  Location: [facility];  Service: General;  Laterality: N/A;   ENDOMETRIAL ABLATION     RADIOACTIVE SEED GUIDED EXCISIONAL BREAST BIOPSY Right 09/12/2015   Procedure: RADIOACTIVE SEED GUIDED EXCISIONAL RIGHT BREAST BIOPSY;  Surgeon: Donnice Bury, MD;  Location: [facility];  Service: General;  Laterality: Right;   TUBAL LIGATION  06-04-2002   WISDOM TOOTH EXTRACTION  2010    Family History  Problem Relation Age of Onset   Arthritis Maternal Uncle    Heart disease Maternal Uncle    Diabetes Mother    Hypertension Mother    Depression Mother    Heart failure Father    Arthritis Maternal Grandmother    Colon cancer Maternal Grandmother    Hypertension Maternal Grandmother    Diabetes Maternal Grandmother    Ovarian cancer Maternal Aunt    Prostate cancer Maternal Uncle    Stroke Maternal  Grandfather    Kidney disease Maternal Grandfather    Heart disease Maternal Grandfather    Kidney failure Maternal Grandfather    Diabetes Maternal Grandfather    Diabetes Paternal Grandmother    Bone cancer Paternal Grandmother    Breast cancer Paternal Grandmother    Prostate cancer Maternal Uncle    Colon cancer Maternal Uncle    Depression Other    Esophageal cancer Neg Hx    Rectal cancer Neg Hx    Stomach cancer Neg Hx     Social History   Socioeconomic History   Marital status: Married    Spouse  name: Not on file   Number of children: 7   Years of education: Not on file   Highest education level: Master's degree (e.g., MA, MS, MEng, MEd, MSW, MBA)  Occupational History   Occupation: Hospital Doctor  Tobacco Use   Smoking status: Never   Smokeless tobacco: Never  Vaping Use   Vaping status: Never Used  Substance and Sexual Activity   Alcohol use: No   Drug use: No   Sexual activity: Yes    Birth control/protection: Surgical  Other Topics Concern   Not on file  Social History Narrative   Not on file   Social Drivers of Health   Financial Resource Strain: Low Risk  (02/29/2024)   Overall Financial Resource Strain (CARDIA)    Difficulty of Paying Living Expenses: Not hard at all  Food Insecurity: No Food Insecurity (02/29/2024)   Hunger Vital Sign    Worried About Running Out of Food in the Last Year: Never true    Ran Out of Food in the Last Year: Never true  Transportation Needs: No Transportation Needs (02/29/2024)   PRAPARE - Administrator, Civil Service (Medical): No    Lack of Transportation (Non-Medical): No  Physical Activity: Sufficiently Active (02/29/2024)   Exercise Vital Sign    Days of Exercise per Week: 4 days    Minutes of Exercise per Session: 40 min  Stress: No Stress Concern Present (02/29/2024)   [employer] of Occupational Health - Occupational Stress Questionnaire    Feeling of Stress: Only a little  Social Connections: Socially Integrated (02/29/2024)   Social Connection and Isolation Panel    Frequency of Communication with Friends and Family: More than three times a week    Frequency of Social Gatherings with Friends and Family: Once a week    Attends Religious Services: More than 4 times per year    Active Member of [employer] or Organizations: Yes    Attends Engineer, Structural: More than 4 times per year    Marital Status: Married  Catering Manager Violence: Not on file    Outpatient Medications Prior  to Visit  Medication Sig Dispense Refill   valACYclovir  (VALTREX ) 1000 MG tablet Take 1 tablet (1,000 mg total) by mouth 3 (three) times daily as needed. 30 tablet 2   Vitamin D , Ergocalciferol , (DRISDOL ) 1.25 MG (50000 UNIT) CAPS capsule Take 1 capsule (50,000 Units total) by mouth once a week. 12 capsule 1   ALPRAZolam  (XANAX ) 0.25 MG tablet Take 1 tablet (0.25 mg total) by mouth 2 (two) times daily as needed for anxiety. 20 tablet 0   Semaglutide -Weight Management (WEGOVY ) 0.25 MG/0.5ML SOAJ Inject 0.25 mg into the skin once a week. 2 mL 0   No facility-administered medications prior to visit.    Allergies  Allergen Reactions   Rabeprazole Sodium Itching and Other (See Comments)  Covid-19 (Adenovirus) Vaccine Other (See Comments)    Per patient---Numb throat   Latex     Other Reaction(s): Not available   Rabeprazole Sodium     Review of Systems  Constitutional:  Positive for malaise/fatigue. Negative for chills and fever.  HENT:  Negative for congestion and hearing loss.   Eyes:  Negative for discharge.  Respiratory:  Negative for cough, sputum production and shortness of breath.   Cardiovascular:  Negative for chest pain, palpitations and leg swelling.  Gastrointestinal:  Negative for abdominal pain, blood in stool, constipation, diarrhea, heartburn, nausea and vomiting.  Genitourinary:  Negative for dysuria, frequency, hematuria and urgency.  Musculoskeletal:  Negative for back pain, falls and myalgias.  Skin:  Negative for rash.  Neurological:  Negative for dizziness, sensory change, loss of consciousness, weakness and headaches.  Endo/Heme/Allergies:  Negative for environmental allergies. Does not bruise/bleed easily.  Psychiatric/Behavioral:  Negative for depression and suicidal ideas. The patient is not nervous/anxious and does not have insomnia.        Objective:    Physical Exam Vitals and nursing note reviewed.  Constitutional:      General: She is not in acute  distress.    Appearance: Normal appearance. She is well-developed.  HENT:     Head: Normocephalic and atraumatic.  Eyes:     General: No scleral icterus.       Right eye: No discharge.        Left eye: No discharge.  Cardiovascular:     Rate and Rhythm: Normal rate and regular rhythm.     Heart sounds: No murmur heard. Pulmonary:     Effort: Pulmonary effort is normal. No respiratory distress.     Breath sounds: Normal breath sounds.  Musculoskeletal:        General: Normal range of motion.     Cervical back: Normal range of motion and neck supple.     Right lower leg: No edema.     Left lower leg: No edema.  Skin:    General: Skin is warm and dry.  Neurological:     Mental Status: She is alert and oriented to person, place, and time.  Psychiatric:        Mood and Affect: Mood normal.        Behavior: Behavior normal.        Thought Content: Thought content normal.        Judgment: Judgment normal.     BP 117/78 (BP Location: Left Arm, Patient Position: Sitting, Cuff Size: Normal)   Pulse 70   Temp 98.1 F (36.7 C) (Oral)   Resp 12   Ht 5' 3 (1.6 m)   Wt 175 lb 12.8 oz (79.7 kg)   SpO2 98%   BMI 31.14 kg/m  Wt Readings from Last 3 Encounters:  02/29/24 175 lb 12.8 oz (79.7 kg)  02/19/23 175 lb (79.4 kg)  02/01/23 178 lb 1.9 oz (80.8 kg)    Diabetic Foot Exam - Simple   No data filed    Lab Results  Component Value Date   WBC 2.9 (L) 07/01/2023   HGB 12.5 07/01/2023   HCT 38.1 07/01/2023   PLT 253.0 07/01/2023   GLUCOSE 92 02/19/2023   CHOL 147 02/19/2023   TRIG 52.0 02/19/2023   HDL 60.50 02/19/2023   LDLCALC 76 02/19/2023   ALT 12 02/19/2023   AST 18 02/19/2023   NA 142 02/19/2023   K 4.1 02/19/2023   CL 102 02/19/2023  CREATININE 0.73 02/19/2023   BUN 14 02/19/2023   CO2 33 (H) 02/19/2023   TSH 1.17 02/19/2023   HGBA1C 6.2 02/19/2023    Lab Results  Component Value Date   TSH 1.17 02/19/2023   Lab Results  Component Value Date   WBC  2.9 (L) 07/01/2023   HGB 12.5 07/01/2023   HCT 38.1 07/01/2023   MCV 87.9 07/01/2023   PLT 253.0 07/01/2023   Lab Results  Component Value Date   NA 142 02/19/2023   K 4.1 02/19/2023   CO2 33 (H) 02/19/2023   GLUCOSE 92 02/19/2023   BUN 14 02/19/2023   CREATININE 0.73 02/19/2023   BILITOT 0.3 02/19/2023   ALKPHOS 60 02/19/2023   AST 18 02/19/2023   ALT 12 02/19/2023   PROT 6.7 02/19/2023   ALBUMIN 4.2 02/19/2023   CALCIUM 9.7 02/19/2023   ANIONGAP 10 01/17/2019   EGFR 115 08/07/2020   GFR 100.31 02/19/2023   Lab Results  Component Value Date   CHOL 147 02/19/2023   Lab Results  Component Value Date   HDL 60.50 02/19/2023   Lab Results  Component Value Date   LDLCALC 76 02/19/2023   Lab Results  Component Value Date   TRIG 52.0 02/19/2023   Lab Results  Component Value Date   CHOLHDL 2 02/19/2023   Lab Results  Component Value Date   HGBA1C 6.2 02/19/2023       Assessment & Plan:  Snoring -     Ambulatory referral to Neurology  Stress -     ALPRAZolam ; Take 1 tablet (0.25 mg total) by mouth at bedtime.  Dispense: 30 tablet; Refill: 1  Dislocation of temporomandibular joint, initial encounter -     Methocarbamol; Take 1 tablet (500 mg total) by mouth 4 (four) times daily.  Dispense: 30 tablet; Refill: 2  Vulvodynia Assessment & Plan: Per gyn   Assessment and Plan Assessment & Plan Snoring and suspected sleep apnea   She experiences persistent and loud snoring, affecting her partner's sleep, with suspected sleep apnea possibly linked to post-COVID vaccine symptoms. She reports a sensation of fluid in her throat and head, which may contribute to the snoring. Refer to [facility] for a sleep study to evaluate for sleep apnea. Consider treatment options based on the sleep study results.  Jaw pain and sleep-related bruxism with temporomandibular joint disorder   She has jaw pain associated with sleep-related bruxism and temporomandibular joint  disorder, experiencing clenching during the day and soreness with night guard use. Previous muscle relaxants caused adverse effects. Prescribe Xanax  nightly for sleep and jaw relaxation. Consider methocarbamol as a nighttime muscle relaxant. Refer to a chiropractor and massage therapist for additional management of jaw pain.  Neck and shoulder pain   Neck and shoulder pain may be related to jaw clenching and bruxism. She seeks relief through chiropractic and massage therapy. Refer to a chiropractor for evaluation and treatment. Recommend massage therapy with Devere Neri for targeted relief of neck and shoulder pain.  Migraine   She experiences migraines and is interested in treatment options that may aid in weight loss. Prescribe Topamax for migraine management with potential weight loss benefit.  Vulvodynia with intermittent neuropathic pain and associated bladder symptoms   She has vulvodynia with intermittent right-sided neuropathic pain and associated bladder symptoms. Symptoms have improved with lifestyle modifications, including dietary changes. She is hesitant to use gabapentin due to side effects. Avoid known dietary triggers. Consider low-dose gabapentin if symptoms worsen or a sciatic  flare occurs.    Kable Haywood R Lowne Chase, DO

## 2024-03-01 ENCOUNTER — Encounter: Payer: Self-pay | Admitting: Family Medicine

## 2024-03-01 ENCOUNTER — Other Ambulatory Visit: Payer: Self-pay | Admitting: Family Medicine

## 2024-03-01 DIAGNOSIS — R0683 Snoring: Secondary | ICD-10-CM | POA: Insufficient documentation

## 2024-03-01 DIAGNOSIS — S0300XA Dislocation of jaw, unspecified side, initial encounter: Secondary | ICD-10-CM | POA: Insufficient documentation

## 2024-03-01 DIAGNOSIS — G43709 Chronic migraine without aura, not intractable, without status migrainosus: Secondary | ICD-10-CM

## 2024-03-01 DIAGNOSIS — N94819 Vulvodynia, unspecified: Secondary | ICD-10-CM | POA: Insufficient documentation

## 2024-03-01 MED ORDER — TOPIRAMATE 25 MG PO TABS: ORAL_TABLET | ORAL | 0 refills | Status: DC

## 2024-03-01 NOTE — Assessment & Plan Note (Signed)
Per gyn 

## 2024-03-02 ENCOUNTER — Encounter: Payer: Self-pay | Admitting: Gastroenterology

## 2024-03-16 ENCOUNTER — Other Ambulatory Visit: Payer: Self-pay

## 2024-03-16 DIAGNOSIS — G43709 Chronic migraine without aura, not intractable, without status migrainosus: Secondary | ICD-10-CM

## 2024-03-16 MED ORDER — TOPIRAMATE 25 MG PO TABS: ORAL_TABLET | ORAL | 0 refills | Status: AC

## 2024-03-28 ENCOUNTER — Ambulatory Visit (AMBULATORY_SURGERY_CENTER)

## 2024-03-28 VITALS — Ht 63.0 in | Wt 176.0 lb

## 2024-03-28 DIAGNOSIS — Z1211 Encounter for screening for malignant neoplasm of colon: Secondary | ICD-10-CM

## 2024-03-28 MED ORDER — NA SULFATE-K SULFATE-MG SULF 17.5-3.13-1.6 GM/177ML PO SOLN: 1.0000 | Freq: Once | ORAL | 0 refills | Status: AC

## 2024-03-28 NOTE — Progress Notes (Signed)
 No issues known to pt with past sedation with any surgeries or procedures Patient denies ever being told they had issues or difficulty with intubation  No FH of Malignant Hyperthermia Pt is not on diet pills nor GLP-1 medication Pt is not on home 02  Pt is not on blood thinners  Pt denies issues with chronic constipation  No A fib or A flutter Have any cardiac testing pending--no Pt instructed to use [email or web address] or GoodRx for a price reduction on prep  Ambulates independently

## 2024-04-04 ENCOUNTER — Encounter: Payer: Self-pay | Admitting: Gastroenterology

## 2024-04-06 ENCOUNTER — Ambulatory Visit: Admitting: Neurology

## 2024-04-06 ENCOUNTER — Encounter: Payer: Self-pay | Admitting: Neurology

## 2024-04-06 VITALS — BP 116/72 | HR 66 | Temp 98.2°F | Ht 63.0 in | Wt 177.0 lb

## 2024-04-06 DIAGNOSIS — S0340XA Sprain of jaw, unspecified side, initial encounter: Secondary | ICD-10-CM

## 2024-04-06 DIAGNOSIS — G478 Other sleep disorders: Secondary | ICD-10-CM | POA: Insufficient documentation

## 2024-04-06 DIAGNOSIS — H6505 Acute serous otitis media, recurrent, left ear: Secondary | ICD-10-CM | POA: Insufficient documentation

## 2024-04-06 DIAGNOSIS — R0683 Snoring: Secondary | ICD-10-CM | POA: Diagnosis not present

## 2024-04-06 NOTE — Progress Notes (Signed)
 @[facility]   Provider:  Dedra Gores, MD    Primary Care Physician:  Anna [name] Anna JONELLE, DO [address] [place] [address]   Referring Provider: Antonio [name], Anna JONELLE, Do [address] [place],  [place] [address]        Chief Concern for this Consultation:   Patient presents with     ESS  7 FSS at  21/ 63      HPI: I have the pleasure of meeting with Anna [name] , on 04/06/24 , who is a 43 y.o.  female patient,  seen upon a referral by Dr Anna [name]  for a  Sleep Medicine Consultation.   The patient's referral information asked for an evaluation of snoring witnessed apneas and non-restorative sleep.   Chief concern according to patient:  This patient  presented with a medical history of :     has a past medical history of Allergy, Anemia, Blood transfusion at birth ,  Excessive weight gain, GERD (gastroesophageal reflux disease), IBS (irritable bowel syndrome), Insulin  resistance, Menopause, Migraine, Other fatigue, PVC (premature ventricular contraction), Vitamin B 12 deficiency and anemia- and Vitamin D  deficiency. Sleep relevant medical/ surgical and symptom history: The patient reports onset of symptoms over a time period of  2021 after her first Covid vaccine , and worse after the second vaccination.  She noted fatigue, and couldn't sleep flat.   ENT surgery or problems: Sinusitis, Otitis , Sleep walking  as a teenager, left the house-  Trauma  to the eye at age 4,  TMJ, GERD, glucose intolerance/ but no dx  endocrinological disorders, no Substance Abuse, Mood disorders: anxiety .  The patient had no previous sleep evaluations.   Family medical history: There are several biological family members affected by Sleep apnea ( mother/ ), or Insomnia (/) , by excessive daytime sleepiness (/).     Social history: she is working as a Chief Financial Officer in pediatrics lives in a private home, in a household with husband,   and has 2  biological and 5 step children. All grown. No pets . The patient used to work in shifts (chief technology officer,) until 2021. [name] The workplace involves physical activity, outdoor activity, travel.  Nicotine use: /.  ETOH use: /,  Caffeine intake in form of: Coffee (1 cup in AM ), Soft drinks (coke - lunchtime), Tea ( rarely and at dinner ) . Exercises regularly in form of walking .  [employer]  Hobbies :  hiking.   Volunteering member of a church and at nursing home.      Sleep habits and routines are as follows: The patient's dinner time is around 8 PM.  Evening time is spent by reading and TV in bed.   The patient goes to bed at, or close to,  10 PM. The bedroom is shared with spouse who wants to leave the TV on (!!)  and is described as cool, quiet, and dark  after 10.30 PM. The patient reports that it takes  10 minutes to fall asleep, then continues to sleep for 6.5  hours, uninterrupted or woken up by one Nocturia  just recently she woke up with ear pain.     The preferred sleep position is on her side - but ear pain forced her to the back , with support of 2-3 pillows, (non- adjustable bed/ recliner ). The total estimated sleep time is circa 6.5 hours.  Dreams  are reportedly rare/ frequent/ and can be vivid.  Dream enactment has  been reported.   She is a sleep talker.  5.30 AM is the usual week- day rise time.  The patient wakes up with an alarm set at 5 AM.  She reports not feeling refreshed and restored in the morning, waking with symptoms such as dry mouth, morning headaches, stiffness or jaw pain, and fatigue.   Sleep paralysis has been experienced.  Naps in daytime are taken on weekends  (there is a desire to nap and opportunity), lasting from 20-30 minutes  and have a refreshing quality. These do not interfere with nocturnal sleep.    Review of Systems: Out of a complete 14 system review, the patient complains of only the following symptoms, and all other reviewed systems are  negative.:  Hypersomnia    Snoring,    How likely are you to doze in the following situations: 0 = not likely, 1 = slight chance, 2 = moderate chance, 3 = high chance Sitting and Reading? Watching Television? Sitting inactive in a public place (theater or meeting)? As a passenger in a car for an hour without a break? Lying down in the afternoon when circumstances permit? Sitting and talking to someone? Sitting quietly after lunch without alcohol? In a car, while stopped for a few minutes in traffic?   Total ESS = 7 / 24 points.    FSS endorsed at 21/ 63 points.  GDS:  Social History   Socioeconomic History   Marital status: Married    Spouse name: Not on file   Number of children: 7   Years of education: Not on file   Highest education level: Master's degree (e.g., MA, MS, MEng, MEd, MSW, MBA)  Occupational History   Occupation: Hospital Doctor  Tobacco Use   Smoking status: Never   Smokeless tobacco: Never  Vaping Use   Vaping status: Never Used  Substance and Sexual Activity   Alcohol use: No   Drug use: No   Sexual activity: Yes    Birth control/protection: Surgical  Other Topics Concern   Not on file  Social History Narrative   Not on file   Social Drivers of Health   Financial Resource Strain: Low Risk  (02/29/2024)   Overall Financial Resource Strain (CARDIA)    Difficulty of Paying Living Expenses: Not hard at all  Food Insecurity: No Food Insecurity (02/29/2024)   Hunger Vital Sign    Worried About Running Out of Food in the Last Year: Never true    Ran Out of Food in the Last Year: Never true  Transportation Needs: No Transportation Needs (02/29/2024)   PRAPARE - Administrator, Civil Service (Medical): No    Lack of Transportation (Non-Medical): No  Physical Activity: Sufficiently Active (02/29/2024)   Exercise Vital Sign    Days of Exercise per Week: 4 days    Minutes of Exercise per Session: 40 min  Stress: No Stress Concern  Present (02/29/2024)   [employer] of Occupational Health - Occupational Stress Questionnaire    Feeling of Stress: Only a little  Social Connections: Socially Integrated (02/29/2024)   Social Connection and Isolation Panel    Frequency of Communication with Friends and Family: More than three times a week    Frequency of Social Gatherings with Friends and Family: Once a week    Attends Religious Services: More than 4 times per year    Active Member of [employer] or Organizations: Yes  Attends Banker Meetings: More than 4 times per year    Marital Status: Married    Family History  Problem Relation Age of Onset   Arthritis Maternal Uncle    Heart disease Maternal Uncle    Diabetes Mother    Hypertension Mother    Depression Mother    Heart failure Father    Arthritis Maternal Grandmother    Colon cancer Maternal Grandmother    Hypertension Maternal Grandmother    Diabetes Maternal Grandmother    Ovarian cancer Maternal Aunt    Prostate cancer Maternal Uncle    Stroke Maternal Grandfather    Kidney disease Maternal Grandfather    Heart disease Maternal Grandfather    Kidney failure Maternal Grandfather    Diabetes Maternal Grandfather    Diabetes Paternal Grandmother    Bone cancer Paternal Grandmother    Breast cancer Paternal Grandmother    Prostate cancer Maternal Uncle    Colon cancer Maternal Uncle    Depression Other    Esophageal cancer Neg Hx    Rectal cancer Neg Hx    Stomach cancer Neg Hx     Past Medical History:  Diagnosis Date   Allergy    Anemia    Blood transfusion without reported diagnosis    at birth   Excessive weight gain    GERD (gastroesophageal reflux disease)    IBS (irritable bowel syndrome)    Insulin  resistance    Menopause    Migraine    Other fatigue    PVC (premature ventricular contraction)    Vitamin B deficiency    Vitamin D  deficiency     Past Surgical History:  Procedure Laterality Date   BREAST  EXCISIONAL BIOPSY     BREAST LUMPECTOMY Right 09/2015   CHOLECYSTECTOMY N/A 09/12/2015   Procedure: LAPAROSCOPIC CHOLECYSTECTOMY;  Surgeon: Donnice Bury, MD;  Location: [facility];  Service: General;  Laterality: N/A;   ENDOMETRIAL ABLATION     RADIOACTIVE SEED GUIDED EXCISIONAL BREAST BIOPSY Right 09/12/2015   Procedure: RADIOACTIVE SEED GUIDED EXCISIONAL RIGHT BREAST BIOPSY;  Surgeon: Donnice Bury, MD;  Location: [facility];  Service: General;  Laterality: Right;   TUBAL LIGATION  06/04/2002   WISDOM TOOTH EXTRACTION  2010     Current Outpatient Medications on File Prior to Visit  Medication Sig Dispense Refill   ALPRAZolam  (XANAX ) 0.25 MG tablet Take 1 tablet (0.25 mg total) by mouth at bedtime. 30 tablet 1   amoxicillin-clavulanate (AUGMENTIN) 875-125 MG tablet Take 1 tablet by mouth 2 (two) times daily.     methocarbamol  (ROBAXIN ) 500 MG tablet Take 1 tablet (500 mg total) by mouth 4 (four) times daily. 30 tablet 2   predniSONE  (DELTASONE ) 20 MG tablet Take 20 mg by mouth 2 (two) times daily with a meal.     topiramate  (TOPAMAX ) 25 MG tablet Take 1 tablet (25 mg total) by mouth every morning AND 2 tablets (50 mg total) every evening. 270 tablet 0   valACYclovir  (VALTREX ) 1000 MG tablet Take 1 tablet (1,000 mg total) by mouth 3 (three) times daily as needed. 30 tablet 2   Vitamin D , Ergocalciferol , (DRISDOL ) 1.25 MG (50000 UNIT) CAPS capsule Take 1 capsule (50,000 Units total) by mouth once a week. 12 capsule 1   No current facility-administered medications on file prior to visit.    Allergies  Allergen Reactions   Covid-19 (Adenovirus) Vaccine Other (See Comments)    Per patient---Numb throat   Rabeprazole Sodium Hives,  Itching and Other (See Comments)   Latex Hives and Other (See Comments)    Other Reaction(s): Not available    Vitals:   04/06/24 0830  BP: 116/72  Pulse: 66  Temp: 98.2 F (36.8 C)  SpO2: 99%      Physical  exam:   General: The patient was alert and appears not in acute distress.  Mood and affect are appropriate .  The patient's interactions are: Cooperative, makes eye contact, follows the instructions and answers questions coherently.  The patient is groomed and appropriately groomed and dressed. Head: Normocephalic, atraumatic.  Neck is supple. Mallampati:   3 (!) .  The neck circumference measured 13. 5  inches. Nasal airflow was not fully  patent ,   Overbite / Retrognathia was noted.  Dental status:  wears a night guard, bruxism Cardiovascular:  Regular rate and cardiac rhythm by palpable pulse. Respiratory: no audible wheezing, no tachypnoea.   Skin:  Without evidence of ankle edema. No discoloration.  Trunk:  BMI is 31. 14   The patient's posture was erect.   Neurologic exam : The patient was awake and alert, oriented to place and time.   Attention span & concentration ability appeared normal.  Speech was fluent, without dysarthria, dysphonia or aphasia, and of normal volume.     Cranial nerves:  There was no loss of smell or taste reported  Pupils are round, equal in size and briskly reactive to light.  Funduscopic exam was deferred.  Extraocular movements in vertical and horizontal planes were intact and without nystagmus. (No Diplopia reported). Visual fields by finger perimetry are intact. Hearing was intact to soft voice.    Facial sensation intact to fine touch.  Facial motor strength: Symmetric movement and tongue and uvula move midline.  Neck ROM: rotation, tilt and flexion extension were intact for age and shoulder shrug was symmetrical.    Motor exam:  Symmetric bulk, strength and ROM.   Normal tone without cog- wheeling, and symmetric grip strength.   Sensory:  Fine touch and vibration were tested by tuning fork and intact.  Proprioception tested in the upper extremities was normal.   Coordination: The patient reported no problems with button closure and no  changes to penmanship.   The Finger-to-nose maneuver was intact without evidence of ataxia, dysmetria or tremor.   Gait and station: Patient could rise unassisted from a seated position, without bracing, and walked without assistive device.  Stance was of normal/ wide width. The patient turned with 3 steps ( observed ). Toe and heel walk were deferred. No limp was noted.    Deep tendon reflexes: Upper extremities did show symmetric DTRs.     I would like to thank Anna Meth, Anna SAUNDERS, DO and [address] [place], Do [address] [place],  [place] [address] for allowing me to meet with her patient,    In short, Mrs Brunke  is presenting with non -restorative sleep, daytime fatigue but not excessive sleepiness, sleep inertia, dry mouth, snoring.  Recently more ENT problems, OTITIS ( !) TMJ  pain,  Risk factors for OSA were present,  including : Body mass index is 31.35 kg/m.and upper airway anatomy. 1) TMJ and related right sided jaw pain and headaches,  affecting the sleep position . 2) Weight  has been stable . 3) stress level at job is high  but  private life is normal    My Plan is to proceed with:  HST/ PSG/  1) I order both, HST and PSG , HST is sufficient to screen for apnea.     I plan to follow up personally within the next 4-5 months /through our NP within 4 months.   A total time of  45  minutes consistent of a part of face to face encounter , exam and interview,  and additional preparation time for chart review was spent .  At today's visit, we discussed treatment options, associated risk and benefits, and engage in counseling as needed including, but not limited to:  Sleep hygiene, Quality Sleep Habits, and Safety concerns for patients with daytime sleepiness who are warned to not operate machinery/ motor vehicles when drowsy. Risk factors for sleep apnea were identified:  Body mass index is 31.35 kg/m..  Additionally, the following were reviewed: Past  medical records, past medical and surgical history, family and social background, as well as relevant laboratory results, imaging findings, and medical notes, where applicable.  This note was generated by myself in part by using dictation software, and as a result, it may contain unintentional typos and errors.  Nevertheless, effort was made to accurately convey the pertinent aspects of the patient's visit.   Anna Gores, MD   [facility] Sleep Board certified in Sleep Medicine by [employer] of Sleep Medicine and Diplomate of the [employer] (AASM) . Board certified In Neurology, Diplomat of the ABPN,  Fellow of the [employer] Neu[employer].

## 2024-04-06 NOTE — Patient Instructions (Addendum)
 Healthy Living: Sleep In this video, you will learn why sleep is an important part of a healthy lifestyle. To view the content, go to this web address: [email or web address]  This video will expire on: 04/14/2025. If you need access to this video following this date, please reach out to the healthcare provider who assigned it to you. This information is not intended to replace advice given to you by your health care provider. Make sure you discuss any questions you have with your health care provider. Elsevier Patient Education  2024 [employer]  Insomnia Insomnia is a sleep disorder that makes it difficult to fall asleep or stay asleep. Insomnia can cause fatigue, low energy, difficulty concentrating, mood swings, and poor performance at work or school. There are three different ways to classify insomnia: Difficulty falling asleep. Difficulty staying asleep. Waking up too early in the morning. Any type of insomnia can be long-term (chronic) or short-term (acute). Both are common. Short-term insomnia usually lasts for 3 months or less. Chronic insomnia occurs at least three times a week for longer than 3 months. What are the causes? Insomnia may be caused by another condition, situation, or substance, such as: Having certain mental health conditions, such as anxiety and depression. Using caffeine, alcohol, tobacco, or drugs. Having gastrointestinal conditions, such as gastroesophageal reflux disease (GERD). Having certain medical conditions. These include: Asthma. Alzheimer's disease. Stroke. Chronic pain. An overactive thyroid  gland (hyperthyroidism). Other sleep disorders, such as restless legs syndrome and sleep apnea. Menopause. Sometimes, the cause of insomnia may not be known. What increases the risk? Risk factors for insomnia include: Gender. Females are affected more often than males. Age. Insomnia is more common as people get older. Stress and certain medical  and mental health conditions. Lack of exercise. Having an irregular work schedule. This may include working night shifts and traveling between different time zones. What are the signs or symptoms? If you have insomnia, the main symptom is having trouble falling asleep or having trouble staying asleep. This may lead to other symptoms, such as: Feeling tired or having low energy. Feeling nervous about going to sleep. Not feeling rested in the morning. Having trouble concentrating. Feeling irritable, anxious, or depressed. How is this diagnosed? This condition may be diagnosed based on: Your symptoms and medical history. Your health care provider may ask about: Your sleep habits. Any medical conditions you have. Your mental health. A physical exam. How is this treated? Treatment for insomnia depends on the cause. Treatment may focus on treating an underlying condition that is causing the insomnia. Treatment may also include: Medicines to help you sleep. Counseling or therapy. Lifestyle adjustments to help you sleep better. Follow these instructions at home: Eating and drinking  Limit or avoid alcohol, caffeinated beverages, and products that contain nicotine and tobacco, especially close to bedtime. These can disrupt your sleep. Do not eat a large meal or eat spicy foods right before bedtime. This can lead to digestive discomfort that can make it hard for you to sleep. Sleep habits  Keep a sleep diary to help you and your health care provider figure out what could be causing your insomnia. Write down: When you sleep. When you wake up during the night. How well you sleep and how rested you feel the next day. Any side effects of medicines you are taking. What you eat and drink. Make your bedroom a dark, comfortable place where it is easy to fall asleep. Put up shades or blackout curtains to block light from  outside. Use a white noise machine to block noise. Keep the temperature  cool. Limit screen use before bedtime. This includes: Not watching TV. Not using your smartphone, tablet, or computer. Stick to a routine that includes going to bed and waking up at the same times every day and night. This can help you fall asleep faster. Consider making a quiet activity, such as reading, part of your nighttime routine. Try to avoid taking naps during the day so that you sleep better at night. Get out of bed if you are still awake after 15 minutes of trying to sleep. Keep the lights down, but try reading or doing a quiet activity. When you feel sleepy, go back to bed. General instructions Take over-the-counter and prescription medicines only as told by your health care provider. Exercise regularly as told by your health care provider. However, avoid exercising in the hours right before bedtime. Use relaxation techniques to manage stress. Ask your health care provider to suggest some techniques that may work well for you. These may include: Breathing exercises. Routines to release muscle tension. Visualizing peaceful scenes. Make sure that you drive carefully. Do not drive if you feel very sleepy. Keep all follow-up visits. This is important. Contact a health care provider if: You are tired throughout the day. You have trouble in your daily routine due to sleepiness. You continue to have sleep problems, or your sleep problems get worse. Get help right away if: You have thoughts about hurting yourself or someone else. Get help right away if you feel like you may hurt yourself or others, or have thoughts about taking your own life. Go to your nearest emergency room or: Call 911. Call the National Suicide Prevention Lifeline at 276-304-8161 or 988. This is open 24 hours a day. Text the Crisis Text Line at 832-297-8035. Summary Insomnia is a sleep disorder that makes it difficult to fall asleep or stay asleep. Insomnia can be long-term (chronic) or short-term (acute). Treatment  for insomnia depends on the cause. Treatment may focus on treating an underlying condition that is causing the insomnia. Keep a sleep diary to help you and your health care provider figure out what could be causing your insomnia. This information is not intended to replace advice given to you by your health care provider. Make sure you discuss any questions you have with your health care provider. Document Revised: 03/31/2021 Document Reviewed: 03/31/2021 Elsevier Patient Education  2024 Elsevier Inc.  Quality Sleep Information, Adult Quality sleep is important for your mental and physical health. It also improves your quality of life. Quality sleep means you: Are asleep for most of the time you are in bed. Fall asleep within 30 minutes. Wake up no more than once a night. Are awake for no longer than 20 minutes if you do wake up during the night. Most adults need 7-8 hours of quality sleep each night. How can poor sleep affect me? If you do not get enough quality sleep, you may have: Mood swings. Daytime sleepiness. Decreased alertness, reaction time, and concentration. Sleep disorders, such as insomnia and sleep apnea. Difficulty with: Solving problems. Coping with stress. Paying attention. These issues may affect your performance and productivity at work, school, and home. Lack of sleep may also put you at higher risk for accidents, suicide, and risky behaviors. If you do not get quality sleep, you may also be at higher risk for several health problems, including: Infections. Type 2 diabetes. Heart disease. High blood pressure. Obesity. Worsening of  long-term conditions, like arthritis, kidney disease, depression, Parkinson's disease, and epilepsy. What actions can I take to get more quality sleep? Sleep schedule and routine Stick to a sleep schedule. Go to sleep and wake up at about the same time each day. Do not try to sleep less on weekdays and make up for lost sleep on  weekends. This does not work. Limit naps during the day to 30 minutes or less. Do not take naps in the late afternoon. Make time to relax before bed. Reading, listening to music, or taking a hot bath promotes quality sleep. Make your bedroom a place that promotes quality sleep. Keep your bedroom dark, quiet, and at a comfortable room temperature. Make sure your bed is comfortable. Avoid using electronic devices that give off bright blue light for 30 minutes before bedtime. Your brain perceives bright blue light as sunlight. This includes television, phones, and computers. If you are lying awake in bed for longer than 20 minutes, get up and do a relaxing activity until you feel sleepy. Lifestyle     Try to get at least 30 minutes of exercise on most days. Do not exercise 2-3 hours before going to bed. Do not use any products that contain nicotine or tobacco. These products include cigarettes, chewing tobacco, and vaping devices, such as e-cigarettes. If you need help quitting, ask your health care provider. Do not drink caffeinated beverages for at least 8 hours before going to bed. Coffee, tea, and some sodas contain caffeine. Do not drink alcohol or eat large meals close to bedtime. Try to get at least 30 minutes of sunlight every day. Morning sunlight is best. Medical concerns Work with your health care provider to treat medical conditions that may affect sleeping, such as: Nasal obstruction. Snoring. Sleep apnea and other sleep disorders. Talk to your health care provider if you think any of your prescription medicines may cause you to have difficulty falling or staying asleep. If you have sleep problems, talk with a sleep consultant. If you think you have a sleep disorder, talk with your health care provider about getting evaluated by a specialist. Where to find more information Sleep Foundation: sleepfoundation.org American Academy of Sleep Medicine: aasm.org Centers for Disease Control  and Prevention (CDC): tonerpromos.no Contact a health care provider if: You have trouble getting to sleep or staying asleep. You often wake up very early in the morning and cannot get back to sleep. You have daytime sleepiness. You have daytime sleep attacks of suddenly falling asleep and sudden muscle weakness (narcolepsy). You have a tingling sensation in your legs with a strong urge to move your legs (restless legs syndrome). You stop breathing briefly during sleep (sleep apnea). You think you have a sleep disorder or are taking a medicine that is affecting your quality of sleep. Summary Most adults need 7-8 hours of quality sleep each night. Getting enough quality sleep is important for your mental and physical health. Make your bedroom a place that promotes quality sleep, and avoid things that may cause you to have poor sleep, such as alcohol, caffeine, smoking, or large meals. Talk to your health care provider if you have trouble falling asleep or staying asleep. This information is not intended to replace advice given to you by your health care provider. Make sure you discuss any questions you have with your health care provider. Document Revised: 08/13/2021 Document Reviewed: 08/13/2021 Elsevier Patient Education  2024 [employer].

## 2024-04-11 ENCOUNTER — Ambulatory Visit: Admitting: Gastroenterology

## 2024-04-11 ENCOUNTER — Encounter: Payer: Self-pay | Admitting: Gastroenterology

## 2024-04-11 VITALS — BP 132/74 | HR 65 | Temp 97.2°F | Resp 15 | Ht 63.0 in | Wt 176.0 lb

## 2024-04-11 DIAGNOSIS — Z1211 Encounter for screening for malignant neoplasm of colon: Secondary | ICD-10-CM

## 2024-04-11 DIAGNOSIS — D127 Benign neoplasm of rectosigmoid junction: Secondary | ICD-10-CM

## 2024-04-11 DIAGNOSIS — K635 Polyp of colon: Secondary | ICD-10-CM | POA: Diagnosis not present

## 2024-04-11 MED ORDER — SODIUM CHLORIDE 0.9 % IV SOLN: 500.0000 mL | INTRAVENOUS | Status: AC

## 2024-04-11 NOTE — Progress Notes (Unsigned)
 Called to room to assist during endoscopic procedure.  Patient ID and intended procedure confirmed with present staff. Received instructions for my participation in the procedure from the performing physician.

## 2024-04-11 NOTE — Patient Instructions (Signed)
 YOU HAD AN ENDOSCOPIC PROCEDURE TODAY AT THE [facility]:   Refer to the procedure report that was given to you for any specific questions about what was found during the examination.  If the procedure report does not answer your questions, please call your gastroenterologist to clarify.  If you requested that your care partner not be given the details of your procedure findings, then the procedure report has been included in a sealed envelope for you to review at your convenience later.  YOU SHOULD EXPECT: Some feelings of bloating in the abdomen. Passage of more gas than usual.  Walking can help get rid of the air that was put into your GI tract during the procedure and reduce the bloating. If you had a lower endoscopy (such as a colonoscopy or flexible sigmoidoscopy) you may notice spotting of blood in your stool or on the toilet paper. If you underwent a bowel prep for your procedure, you may not have a normal bowel movement for a few days.  Please Note:  You might notice some irritation and congestion in your nose or some drainage.  This is from the oxygen used during your procedure.  There is no need for concern and it should clear up in a day or so.  SYMPTOMS TO REPORT IMMEDIATELY:  Following lower endoscopy (colonoscopy or flexible sigmoidoscopy):  Excessive amounts of blood in the stool  Significant tenderness or worsening of abdominal pains  Swelling of the abdomen that is new, acute  Fever of 100F or higher   For urgent or emergent issues, a gastroenterologist can be reached at any hour by calling [phone]. Do not use MyChart messaging for urgent concerns.    DIET:  We do recommend a small meal at first, but then you may proceed to your regular diet.  Drink plenty of fluids but you should avoid alcoholic beverages for 24 hours.  MEDICATIONS: Continue present medications.  FOLLOW UP: Await pathology results. Repeat colonoscopy in 5-10 years for surveillance based  on pathology results.  Educational handouts given to patient: Polyps, Hemorrhoids.  Thank you for allowing us  to provide for your healthcare needs today.  ACTIVITY:  You should plan to take it easy for the rest of today and you should NOT DRIVE or use heavy machinery until tomorrow (because of the sedation medicines used during the test).    FOLLOW UP: Our staff will call the number listed on your records the next business day following your procedure.  We will call around 7:15- 8:00 am to check on you and address any questions or concerns that you may have regarding the information given to you following your procedure. If we do not reach you, we will leave a message.     If any biopsies were taken you will be contacted by phone or by letter within the next 1-3 weeks.  Please call us  at [phone] if you have not heard about the biopsies in 3 weeks.    SIGNATURES/CONFIDENTIALITY: You and/or your care partner have signed paperwork which will be entered into your electronic medical record.  These signatures attest to the fact that that the information above on your After Visit Summary has been reviewed and is understood.  Full responsibility of the confidentiality of this discharge information lies with you and/or your care-partner.

## 2024-04-11 NOTE — Progress Notes (Unsigned)
 Pt's states no medical or surgical changes since previsit or office visit.

## 2024-04-11 NOTE — Op Note (Signed)
 [facility] Patient Name: Anna [name] Procedure Date: 04/11/2024 2:44 PM MRN: [id number] Endoscopist: Gustav ALONSO Mcgee , MD, [id number] Age: 43 Referring MD:  Date of Birth: Jan 05, 1981 Gender: Female Account #: [id number] Procedure:                Colonoscopy Indications:              Screening for colorectal malignant neoplasm Medicines:                Monitored Anesthesia Care Procedure:                Pre-Anesthesia Assessment:                           - Prior to the procedure, a History and Physical                            was performed, and patient medications and                            allergies were reviewed. The patient's tolerance of                            previous anesthesia was also reviewed. The risks                            and benefits of the procedure and the sedation                            options and risks were discussed with the patient.                            All questions were answered, and informed consent                            was obtained. Prior Anticoagulants: The patient has                            taken no anticoagulant or antiplatelet agents. ASA                            Grade Assessment: II - A patient with mild systemic                            disease. After reviewing the risks and benefits,                            the patient was deemed in satisfactory condition to                            undergo the procedure.                           After obtaining informed consent, the colonoscope  was passed under direct vision. Throughout the                            procedure, the patient's blood pressure, pulse, and                            oxygen saturations were monitored continuously. The                            Olympus Scope SN [id number] was introduced through the                            anus and advanced to the the cecum, identified by                             appendiceal orifice and ileocecal valve. The                            colonoscopy was performed without difficulty. The                            patient tolerated the procedure well. The quality                            of the bowel preparation was good. The ileocecal                            valve, appendiceal orifice, and rectum were                            photographed. Scope In: 3:01:33 PM Scope Out: 3:15:57 PM Scope Withdrawal Time: 0 hours 10 minutes 30 seconds  Total Procedure Duration: 0 hours 14 minutes 24 seconds  Findings:                 The perianal and digital rectal examinations were                            normal.                           Five sessile polyps were found in the recto-sigmoid                            colon. The polyps were 1 to 2 mm in size. These                            polyps were removed with a cold biopsy forceps.                            Resection and retrieval were complete.                           Non-bleeding external and internal hemorrhoids were  found during retroflexion. The hemorrhoids were                            medium-sized. Complications:            No immediate complications. Estimated Blood Loss:     Estimated blood loss was minimal. Impression:               - Five 1 to 2 mm polyps at the recto-sigmoid colon,                            removed with a cold biopsy forceps. Resected and                            retrieved.                           - Non-bleeding external and internal hemorrhoids. Recommendation:           - Resume previous diet.                           - Continue present medications.                           - Await pathology results.                           - Repeat colonoscopy in 5-10 years for surveillance                            based on pathology results. Jamile Sivils V. Rayyan Orsborn, MD 04/11/2024 3:22:59 PM This report has been signed electronically.

## 2024-04-11 NOTE — Progress Notes (Unsigned)
 [facility] History and Physical   Primary Care Physician:  Antonio Cyndee Jamee JONELLE, DO   Reason for Procedure:  Colorectal cancer screening  Plan:    Screening colonoscopy with possible interventions as needed     HPI: Anna [name] is a very pleasant 43 y.o. female here for screening colonoscopy. Denies any nausea, vomiting, abdominal pain, melena or bright red blood per rectum  The risks and benefits as well as alternatives of endoscopic procedure(s) have been discussed and reviewed.  The patient was provided an opportunity to ask questions and all were answered. The patient agreed with the plan and demonstrated an understanding of the instructions.   Past Medical History:  Diagnosis Date   Allergy    Anemia    Blood transfusion without reported diagnosis    at birth   Excessive weight gain    GERD (gastroesophageal reflux disease)    IBS (irritable bowel syndrome)    Insulin  resistance    Menopause    Migraine    Other fatigue    PVC (premature ventricular contraction)    Vitamin B deficiency    Vitamin D  deficiency     Past Surgical History:  Procedure Laterality Date   BREAST EXCISIONAL BIOPSY     BREAST LUMPECTOMY Right 09/2015   CHOLECYSTECTOMY N/A 09/12/2015   Procedure: LAPAROSCOPIC CHOLECYSTECTOMY;  Surgeon: Donnice Bury, MD;  Location: [facility];  Service: General;  Laterality: N/A;   ENDOMETRIAL ABLATION     RADIOACTIVE SEED GUIDED EXCISIONAL BREAST BIOPSY Right 09/12/2015   Procedure: RADIOACTIVE SEED GUIDED EXCISIONAL RIGHT BREAST BIOPSY;  Surgeon: Donnice Bury, MD;  Location: [facility];  Service: General;  Laterality: Right;   TUBAL LIGATION  06/04/2002   WISDOM TOOTH EXTRACTION  2010    Prior to Admission medications   Medication Sig Start Date End Date Taking? Authorizing Provider  amoxicillin-clavulanate (AUGMENTIN) 875-125 MG tablet Take 1 tablet by mouth 2 (two) times daily.   Yes  [provider]  Cyanocobalamin  (B-12 COMPLIANCE INJECTION IJ) Inject as directed every 30 (thirty) days.   Yes [provider]  predniSONE  (DELTASONE ) 20 MG tablet Take 20 mg by mouth 2 (two) times daily with a meal.   Yes [provider]  ALPRAZolam  (XANAX ) 0.25 MG tablet Take 1 tablet (0.25 mg total) by mouth at bedtime. 02/29/24   Antonio Cyndee Jamee JONELLE, DO  methocarbamol  (ROBAXIN ) 500 MG tablet Take 1 tablet (500 mg total) by mouth 4 (four) times daily. 02/29/24   Antonio Cyndee Jamee JONELLE, DO  topiramate  (TOPAMAX ) 25 MG tablet Take 1 tablet (25 mg total) by mouth every morning AND 2 tablets (50 mg total) every evening. 03/16/24   Lowne Chase, Yvonne R, DO  valACYclovir  (VALTREX ) 1000 MG tablet Take 1 tablet (1,000 mg total) by mouth 3 (three) times daily as needed. 10/07/21   Antonio Cyndee Jamee JONELLE, DO  Vitamin D , Ergocalciferol , (DRISDOL ) 1.25 MG (50000 UNIT) CAPS capsule Take 1 capsule (50,000 Units total) by mouth once a week. 02/26/23   Lowne Chase, Yvonne R, DO    Current Outpatient Medications  Medication Sig Dispense Refill   amoxicillin-clavulanate (AUGMENTIN) 875-125 MG tablet Take 1 tablet by mouth 2 (two) times daily.     Cyanocobalamin  (B-12 COMPLIANCE INJECTION IJ) Inject as directed every 30 (thirty) days.     predniSONE  (DELTASONE ) 20 MG tablet Take 20 mg by mouth 2 (two) times daily with a meal.     ALPRAZolam  (XANAX ) 0.25 MG tablet  Take 1 tablet (0.25 mg total) by mouth at bedtime. 30 tablet 1   methocarbamol  (ROBAXIN ) 500 MG tablet Take 1 tablet (500 mg total) by mouth 4 (four) times daily. 30 tablet 2   topiramate  (TOPAMAX ) 25 MG tablet Take 1 tablet (25 mg total) by mouth every morning AND 2 tablets (50 mg total) every evening. 270 tablet 0   valACYclovir  (VALTREX ) 1000 MG tablet Take 1 tablet (1,000 mg total) by mouth 3 (three) times daily as needed. 30 tablet 2   Vitamin D , Ergocalciferol , (DRISDOL ) 1.25 MG (50000 UNIT) CAPS capsule Take 1 capsule  (50,000 Units total) by mouth once a week. 12 capsule 1   Current Facility-Administered Medications  Medication Dose Route Frequency Provider Last Rate Last Admin   0.9 %  sodium chloride  infusion  500 mL Intravenous Continuous Shaylon Aden V, MD        Allergies as of 04/11/2024 - Review Complete 04/11/2024  Allergen Reaction Noted   Covid-19 (adenovirus) vaccine Other (See Comments) 02/14/2021   Rabeprazole sodium Hives, Itching, and Other (See Comments) 12/03/2017   Latex Hives and Other (See Comments) 09/23/2010    Family History  Problem Relation Age of Onset   Diabetes Mother    Hypertension Mother    Depression Mother    Heart failure Father    Ovarian cancer Maternal Aunt    Prostate cancer Maternal Uncle    Arthritis Maternal Uncle    Heart disease Maternal Uncle    Prostate cancer Maternal Uncle    Prostate cancer Maternal Uncle    Arthritis Maternal Grandmother    Colon cancer Maternal Grandmother    Hypertension Maternal Grandmother    Diabetes Maternal Grandmother    Stroke Maternal Grandfather    Kidney disease Maternal Grandfather    Heart disease Maternal Grandfather    Kidney failure Maternal Grandfather    Diabetes Maternal Grandfather    Diabetes Paternal Grandmother    Bone cancer Paternal Grandmother    Breast cancer Paternal Grandmother    Depression Other    Esophageal cancer Neg Hx    Rectal cancer Neg Hx    Stomach cancer Neg Hx     Social History   Socioeconomic History   Marital status: Married    Spouse name: Not on file   Number of children: 7   Years of education: Not on file   Highest education level: Master's degree (e.g., MA, MS, MEng, MEd, MSW, MBA)  Occupational History   Occupation: Hospital Doctor  Tobacco Use   Smoking status: Never   Smokeless tobacco: Never  Vaping Use   Vaping status: Never Used  Substance and Sexual Activity   Alcohol use: No   Drug use: No   Sexual activity: Yes    Birth  control/protection: Surgical  Other Topics Concern   Not on file  Social History Narrative   Not on file   Social Drivers of Health   Financial Resource Strain: Low Risk  (02/29/2024)   Overall Financial Resource Strain (CARDIA)    Difficulty of Paying Living Expenses: Not hard at all  Food Insecurity: No Food Insecurity (02/29/2024)   Hunger Vital Sign    Worried About Running Out of Food in the Last Year: Never true    Ran Out of Food in the Last Year: Never true  Transportation Needs: No Transportation Needs (02/29/2024)   PRAPARE - Administrator, Civil Service (Medical): No    Lack of Transportation (Non-Medical): No  Physical  Activity: Sufficiently Active (02/29/2024)   Exercise Vital Sign    Days of Exercise per Week: 4 days    Minutes of Exercise per Session: 40 min  Stress: No Stress Concern Present (02/29/2024)   [employer] of Occupational Health - Occupational Stress Questionnaire    Feeling of Stress: Only a little  Social Connections: Socially Integrated (02/29/2024)   Social Connection and Isolation Panel    Frequency of Communication with Friends and Family: More than three times a week    Frequency of Social Gatherings with Friends and Family: Once a week    Attends Religious Services: More than 4 times per year    Active Member of [employer] or Organizations: Yes    Attends Engineer, Structural: More than 4 times per year    Marital Status: Married  Catering Manager Violence: Not on file    Review of Systems:  All other review of systems negative except as mentioned in the HPI.  Physical Exam: Vital signs in last 24 hours: BP 106/70   Pulse 73   Temp (!) 97.2 F (36.2 C) (Temporal)   Ht 5' 3 (1.6 m)   Wt 176 lb (79.8 kg)   SpO2 100%   BMI 31.18 kg/m  General:   Alert, NAD Lungs:  Clear .   Heart:  Regular rate and rhythm Abdomen:  Soft, nontender and nondistended. Neuro/Psych:  Alert and cooperative. Normal mood and  affect. A and O x 3  Reviewed labs, radiology imaging, old records and pertinent past GI work up  Patient is appropriate for planned procedure(s) and anesthesia in an ambulatory setting   K. Veena Cristo Ausburn , MD [phone]

## 2024-04-11 NOTE — Progress Notes (Unsigned)
 Vss nad trans to pacu

## 2024-04-12 ENCOUNTER — Encounter: Payer: Self-pay | Admitting: Gastroenterology

## 2024-04-12 ENCOUNTER — Telehealth: Payer: Self-pay

## 2024-04-12 NOTE — Telephone Encounter (Signed)
 No answer, left message to call if having any issues or concerns, B.Vester Titsworth RN

## 2024-04-13 ENCOUNTER — Ambulatory Visit: Admitting: Family Medicine

## 2024-04-14 LAB — SURGICAL PATHOLOGY

## 2024-04-21 ENCOUNTER — Ambulatory Visit (INDEPENDENT_AMBULATORY_CARE_PROVIDER_SITE_OTHER): Admitting: Neurology

## 2024-04-21 DIAGNOSIS — G4733 Obstructive sleep apnea (adult) (pediatric): Secondary | ICD-10-CM

## 2024-04-21 DIAGNOSIS — G478 Other sleep disorders: Secondary | ICD-10-CM

## 2024-04-21 DIAGNOSIS — S0340XA Sprain of jaw, unspecified side, initial encounter: Secondary | ICD-10-CM

## 2024-04-21 DIAGNOSIS — H6505 Acute serous otitis media, recurrent, left ear: Secondary | ICD-10-CM

## 2024-04-21 DIAGNOSIS — R0683 Snoring: Secondary | ICD-10-CM

## 2024-05-25 NOTE — Progress Notes (Signed)
 "    HPI: Fu chest pain. Echocardiogram September 2020 showed normal LV function and trace aortic insufficiency.  Monitor September 2020 showed sinus to sinus tachycardia.  Carotid Dopplers October 2022 showed no significant stenosis though duplex suggesting fibromuscular dysplasia.  CTA October 2022 was normal.  Coronary CTA June 2023 showed calcium score 0 and no coronary disease.  Since last seen patient denies dyspnea, chest pain, palpitations or syncope.  Current Outpatient Medications  Medication Sig Dispense Refill   ALPRAZolam  (XANAX ) 0.25 MG tablet Take 1 tablet (0.25 mg total) by mouth at bedtime. 30 tablet 1   Cyanocobalamin  (B-12 COMPLIANCE INJECTION IJ) Inject as directed every 30 (thirty) days.     predniSONE  (DELTASONE ) 20 MG tablet Take 20 mg by mouth 2 (two) times daily with a meal.     topiramate  (TOPAMAX ) 25 MG tablet Take 1 tablet (25 mg total) by mouth every morning AND 2 tablets (50 mg total) every evening. 270 tablet 0   valACYclovir  (VALTREX ) 1000 MG tablet Take 1 tablet (1,000 mg total) by mouth 3 (three) times daily as needed. 30 tablet 2   Vitamin D , Ergocalciferol , (DRISDOL ) 1.25 MG (50000 UNIT) CAPS capsule Take 1 capsule (50,000 Units total) by mouth once a week. 12 capsule 1   amoxicillin-clavulanate (AUGMENTIN) 875-125 MG tablet Take 1 tablet by mouth 2 (two) times daily. (Patient not taking: Reported on 06/07/2024)     methocarbamol  (ROBAXIN ) 500 MG tablet Take 1 tablet (500 mg total) by mouth 4 (four) times daily. (Patient not taking: Reported on 06/07/2024) 30 tablet 2   No current facility-administered medications for this visit.     Past Medical History:  Diagnosis Date   Allergy    Anemia    Blood transfusion without reported diagnosis    at birth   Excessive weight gain    GERD (gastroesophageal reflux disease)    IBS (irritable bowel syndrome)    Insulin  resistance    Menopause    Migraine    Other fatigue    PVC (premature ventricular contraction)     Vitamin B deficiency    Vitamin D  deficiency     Past Surgical History:  Procedure Laterality Date   BREAST EXCISIONAL BIOPSY     BREAST LUMPECTOMY Right 09/2015   CHOLECYSTECTOMY N/A 09/12/2015   Procedure: LAPAROSCOPIC CHOLECYSTECTOMY;  Surgeon: Donnice Bury, MD;  Location: [facility];  Service: General;  Laterality: N/A;   ENDOMETRIAL ABLATION     RADIOACTIVE SEED GUIDED EXCISIONAL BREAST BIOPSY Right 09/12/2015   Procedure: RADIOACTIVE SEED GUIDED EXCISIONAL RIGHT BREAST BIOPSY;  Surgeon: Donnice Bury, MD;  Location: [facility];  Service: General;  Laterality: Right;   TUBAL LIGATION  06/04/2002   WISDOM TOOTH EXTRACTION  2010    Social History   Socioeconomic History   Marital status: Married    Spouse name: Not on file   Number of children: 7   Years of education: Not on file   Highest education level: Master's degree (e.g., MA, MS, MEng, MEd, MSW, MBA)  Occupational History   Occupation: Hospital Doctor  Tobacco Use   Smoking status: Never   Smokeless tobacco: Never  Vaping Use   Vaping status: Never Used  Substance and Sexual Activity   Alcohol use: No   Drug use: No   Sexual activity: Yes    Birth control/protection: Surgical  Other Topics Concern   Not on file  Social History Narrative   Not on file   Social Drivers  of Health   Tobacco Use: Low Risk (06/07/2024)   Patient History    Smoking Tobacco Use: Never    Smokeless Tobacco Use: Never    Passive Exposure: Not on file  Financial Resource Strain: Low Risk (02/29/2024)   Overall Financial Resource Strain (CARDIA)    Difficulty of Paying Living Expenses: Not hard at all  Food Insecurity: No Food Insecurity (02/29/2024)   Epic    Worried About Programme Researcher, Broadcasting/film/video in the Last Year: Never true    Ran Out of Food in the Last Year: Never true  Transportation Needs: No Transportation Needs (02/29/2024)   Epic    Lack of Transportation (Medical): No     Lack of Transportation (Non-Medical): No  Physical Activity: Sufficiently Active (02/29/2024)   Exercise Vital Sign    Days of Exercise per Week: 4 days    Minutes of Exercise per Session: 40 min  Stress: No Stress Concern Present (02/29/2024)   [employer] of Occupational Health - Occupational Stress Questionnaire    Feeling of Stress: Only a little  Social Connections: Socially Integrated (02/29/2024)   Social Connection and Isolation Panel    Frequency of Communication with Friends and Family: More than three times a week    Frequency of Social Gatherings with Friends and Family: Once a week    Attends Religious Services: More than 4 times per year    Active Member of [employer] or Organizations: Yes    Attends Engineer, Structural: More than 4 times per year    Marital Status: Married  Catering Manager Violence: Not on file  Depression (PHQ2-9): Low Risk (02/19/2023)   Depression (PHQ2-9)    PHQ-2 Score: 0  Alcohol Screen: Not on file  Housing: Low Risk (02/29/2024)   Epic    Unable to Pay for Housing in the Last Year: No    Number of Times Moved in the Last Year: 0    Homeless in the Last Year: No  Utilities: Not on file  Health Literacy: Not on file    Family History  Problem Relation Age of Onset   Diabetes Mother    Hypertension Mother    Depression Mother    Heart failure Father    Ovarian cancer Maternal Aunt    Prostate cancer Maternal Uncle    Arthritis Maternal Uncle    Heart disease Maternal Uncle    Prostate cancer Maternal Uncle    Prostate cancer Maternal Uncle    Arthritis Maternal Grandmother    Colon cancer Maternal Grandmother    Hypertension Maternal Grandmother    Diabetes Maternal Grandmother    Stroke Maternal Grandfather    Kidney disease Maternal Grandfather    Heart disease Maternal Grandfather    Kidney failure Maternal Grandfather    Diabetes Maternal Grandfather    Diabetes Paternal Grandmother    Bone cancer Paternal  Grandmother    Breast cancer Paternal Grandmother    Depression Other    Esophageal cancer Neg Hx    Rectal cancer Neg Hx    Stomach cancer Neg Hx     ROS: no fevers or chills, productive cough, hemoptysis, dysphasia, odynophagia, melena, hematochezia, dysuria, hematuria, rash, seizure activity, orthopnea, PND, pedal edema, claudication. Remaining systems are negative.  Physical Exam: Well-developed well-nourished in no acute distress.  Skin is warm and dry.  HEENT is normal.  Neck is supple.  Chest is clear to auscultation with normal expansion.  Cardiovascular exam is regular rate and rhythm.  Abdominal  exam nontender or distended. No masses palpated. Extremities show no edema. neuro grossly intact  EKG Interpretation Date/Time:  Wednesday June 07 2024 14:54:08 EST Ventricular Rate:  74 PR Interval:  150 QRS Duration:  70 QT Interval:  354 QTC Calculation: 392 R Axis:   39  Text Interpretation: Normal sinus rhythm T wave abnormality, consider anterior ischemia Confirmed by Pietro Rogue ([id number]) on 06/07/2024 2:56:11 PM  Note she has had some anterior T wave inversion on previous ECG.  A/P  1 history of chest pain-patient has had no recurrences since last office visit.  Previous CTA showed no coronary disease.  2 history of PVCs-no recent symptoms.  Will consider addition of beta-blocker in the future as needed.  3 abnormal ECG-some T wave inversion anteriorly which is present on prior studies.  We have given her a copy to carry with her for future reference if she ever has evaluations and other emergency room's.  Rogue Pietro, MD    "

## 2024-05-31 ENCOUNTER — Encounter: Payer: Self-pay | Admitting: Neurology

## 2024-06-02 ENCOUNTER — Ambulatory Visit: Payer: Self-pay | Admitting: Gastroenterology

## 2024-06-07 ENCOUNTER — Ambulatory Visit: Admitting: Cardiology

## 2024-06-07 ENCOUNTER — Encounter: Payer: Self-pay | Admitting: Cardiology

## 2024-06-07 ENCOUNTER — Ambulatory Visit: Payer: Self-pay | Admitting: Neurology

## 2024-06-07 VITALS — BP 107/73 | HR 75 | Ht 63.0 in | Wt 175.0 lb

## 2024-06-07 DIAGNOSIS — R002 Palpitations: Secondary | ICD-10-CM

## 2024-06-07 DIAGNOSIS — I493 Ventricular premature depolarization: Secondary | ICD-10-CM

## 2024-06-07 DIAGNOSIS — R072 Precordial pain: Secondary | ICD-10-CM | POA: Diagnosis not present

## 2024-06-07 NOTE — Procedures (Signed)
" ° °  °  °  [facility]  Anna [name] 44 year old female 1981/02/09   HOME SLEEP TEST REPORT ( by Elene  mail -out device )    STUDY DATE:   05-15-2023  Data received : 06-07-2024      ORDERING CLINICIAN: Dedra Gores, MD  REFERRING CLINICIAN:  Dr Antonio Meth   CLINICAL INFORMATION/HISTORY:  04-06-2024 , non restorative sleep. In short, [name]  is presenting with non -restorative sleep, daytime fatigue but not excessive sleepiness, sleep inertia, dry mouth, snoring.  Recently more ENT problems, Otitis and TMJ  pain,     Epworth sleepiness score:   7 / 24 points.    FSS endorsed at 21/ 63 points.   BMI: 31.35 kg/m  Neck Circumference: 13.5      Sleep Summary:   Star of Recording Time (hours, min):   05-14-2024 at 21 hours and 3 minutes      Total Sleep Time (hours, min):   6 hours and 37 minutes  Sleep efficiency %; 79%   REM sleep : 1 h 44 minutes.  Normal sleep architecture                                 Respiratory Indices ( AHI )  by AASM  criteria of scoring;    Calculated pAHI (per hour):    3.0/h                                              Positional AHI : supine sleep AHI was 3.1/h   REM sleep AHI: 4.2/h   Oxygen Saturation during Sleep:   Oxygen Saturation (%) Mean at 94.6 % with an  O2 Saturation Range (%) between 80 and 99.5 %                                        O2 Saturation time (minutes) <89%:  2 minutes         Pulse Rate during Sleep :   Pulse Mean (bpm) was  70 bpm  in regular rhythm,  Pulse Range between 53  bpm and  88  bpm.,                 IMPRESSION:  This HST did not show any evidence of sleep apnea, hypoxia or cardiac arrhythmia    RECOMMENDATION:  No physiological explanation for the chief concern of non restorative sleep was  found.    I certify that I have reviewed the raw data recording prior to the issuance of this report in accordance with the standards of the American Academy of Sleep Medicine  (AASM).    INTERPRETING PHYSICIAN:  Dedra Gores, MD  [facility] Sleep Board certified by [employer] of Sleep Medicine and Diplomate of the [employer] of Sleep Medicine. Board certified In Neurology through the ABPN, Fellow of the [employer] of Neurology.             "

## 2024-06-07 NOTE — Progress Notes (Signed)
" ° °  °  °  [facility]  AFIA MESSENGER 44 year old female 1981/02/09   HOME SLEEP TEST REPORT ( by Elene  mail -out device )    STUDY DATE:   05-15-2023  Data received : 06-07-2024      ORDERING CLINICIAN: Dedra Gores, MD  REFERRING CLINICIAN:  Dr Antonio Meth   CLINICAL INFORMATION/HISTORY:  04-06-2024 , non restorative sleep. In short, [name]  is presenting with non -restorative sleep, daytime fatigue but not excessive sleepiness, sleep inertia, dry mouth, snoring.  Recently more ENT problems, Otitis and TMJ  pain,     Epworth sleepiness score:   7 / 24 points.    FSS endorsed at 21/ 63 points.   BMI: 31.35 kg/m  Neck Circumference: 13.5      Sleep Summary:   Star of Recording Time (hours, min):   05-14-2024 at 21 hours and 3 minutes      Total Sleep Time (hours, min):   6 hours and 37 minutes  Sleep efficiency %; 79%   REM sleep : 1 h 44 minutes.  Normal sleep architecture                                 Respiratory Indices ( AHI )  by AASM  criteria of scoring;    Calculated pAHI (per hour):    3.0/h                                              Positional AHI : supine sleep AHI was 3.1/h   REM sleep AHI: 4.2/h   Oxygen Saturation during Sleep:   Oxygen Saturation (%) Mean at 94.6 % with an  O2 Saturation Range (%) between 80 and 99.5 %                                        O2 Saturation time (minutes) <89%:  2 minutes         Pulse Rate during Sleep :   Pulse Mean (bpm) was  70 bpm  in regular rhythm,  Pulse Range between 53  bpm and  88  bpm.,                 IMPRESSION:  This HST did not show any evidence of sleep apnea, hypoxia or cardiac arrhythmia    RECOMMENDATION:  No physiological explanation for the chief concern of non restorative sleep was  found.    I certify that I have reviewed the raw data recording prior to the issuance of this report in accordance with the standards of the American Academy of Sleep Medicine  (AASM).    INTERPRETING PHYSICIAN:  Dedra Gores, MD  [facility] Sleep Board certified by [employer] of Sleep Medicine and Diplomate of the [employer] of Sleep Medicine. Board certified In Neurology through the ABPN, Fellow of the [employer] of Neu[employer].             "

## 2024-06-07 NOTE — Patient Instructions (Signed)

## 2024-08-17 ENCOUNTER — Ambulatory Visit: Admitting: Neurology
